# Patient Record
Sex: Female | Born: 1935 | Race: White | Hispanic: No | Marital: Married | Smoking: Never smoker
Health system: Southern US, Community
[De-identification: ages and names within clinical notes are randomized; demographics above are authoritative.]

## PROBLEM LIST (undated history)

## (undated) DIAGNOSIS — A498 Other bacterial infections of unspecified site: Secondary | ICD-10-CM

## (undated) HISTORY — PX: BLADDER SURGERY: SHX569

## (undated) HISTORY — PX: OTHER SURGICAL HISTORY: SHX169

## (undated) HISTORY — PX: CHOLECYSTECTOMY: SHX55

## (undated) HISTORY — PX: FOOT SURGERY: SHX648

## (undated) HISTORY — PX: ABDOMINAL HYSTERECTOMY: SHX81

---

## 1998-11-06 ENCOUNTER — Ambulatory Visit: Admission: RE | Admit: 1998-11-06 | Discharge: 1998-11-06 | Payer: Self-pay | Admitting: Family Medicine

## 1999-08-12 ENCOUNTER — Encounter: Admission: RE | Admit: 1999-08-12 | Discharge: 1999-08-12 | Payer: Self-pay | Admitting: Family Medicine

## 1999-08-12 ENCOUNTER — Encounter: Payer: Self-pay | Admitting: Family Medicine

## 2000-08-12 ENCOUNTER — Encounter: Admission: RE | Admit: 2000-08-12 | Discharge: 2000-08-12 | Payer: Self-pay | Admitting: Family Medicine

## 2000-08-12 ENCOUNTER — Encounter: Payer: Self-pay | Admitting: Family Medicine

## 2001-08-15 ENCOUNTER — Encounter: Admission: RE | Admit: 2001-08-15 | Discharge: 2001-08-15 | Payer: Self-pay | Admitting: Family Medicine

## 2001-08-15 ENCOUNTER — Encounter: Payer: Self-pay | Admitting: Family Medicine

## 2001-11-29 ENCOUNTER — Ambulatory Visit (HOSPITAL_COMMUNITY): Admission: RE | Admit: 2001-11-29 | Discharge: 2001-11-29 | Payer: Self-pay | Admitting: *Deleted

## 2001-11-29 ENCOUNTER — Encounter: Payer: Self-pay | Admitting: *Deleted

## 2001-12-06 ENCOUNTER — Encounter: Payer: Self-pay | Admitting: General Surgery

## 2001-12-07 ENCOUNTER — Ambulatory Visit (HOSPITAL_COMMUNITY): Admission: RE | Admit: 2001-12-07 | Discharge: 2001-12-07 | Payer: Self-pay | Admitting: General Surgery

## 2001-12-22 ENCOUNTER — Observation Stay (HOSPITAL_COMMUNITY): Admission: RE | Admit: 2001-12-22 | Discharge: 2001-12-23 | Payer: Self-pay | Admitting: General Surgery

## 2001-12-22 ENCOUNTER — Encounter (INDEPENDENT_AMBULATORY_CARE_PROVIDER_SITE_OTHER): Payer: Self-pay | Admitting: Specialist

## 2002-08-22 ENCOUNTER — Encounter: Admission: RE | Admit: 2002-08-22 | Discharge: 2002-08-22 | Payer: Self-pay | Admitting: Family Medicine

## 2002-08-22 ENCOUNTER — Encounter: Payer: Self-pay | Admitting: Family Medicine

## 2003-09-17 ENCOUNTER — Encounter: Admission: RE | Admit: 2003-09-17 | Discharge: 2003-09-17 | Payer: Self-pay | Admitting: Family Medicine

## 2003-11-06 ENCOUNTER — Other Ambulatory Visit: Admission: RE | Admit: 2003-11-06 | Discharge: 2003-11-06 | Payer: Self-pay | Admitting: Family Medicine

## 2004-09-21 ENCOUNTER — Encounter: Admission: RE | Admit: 2004-09-21 | Discharge: 2004-09-21 | Payer: Self-pay | Admitting: Family Medicine

## 2005-09-27 ENCOUNTER — Encounter: Admission: RE | Admit: 2005-09-27 | Discharge: 2005-09-27 | Payer: Self-pay | Admitting: Family Medicine

## 2006-10-10 ENCOUNTER — Encounter: Admission: RE | Admit: 2006-10-10 | Discharge: 2006-10-10 | Payer: Self-pay | Admitting: Family Medicine

## 2007-10-12 ENCOUNTER — Encounter: Admission: RE | Admit: 2007-10-12 | Discharge: 2007-10-12 | Payer: Self-pay | Admitting: Family Medicine

## 2008-09-10 ENCOUNTER — Ambulatory Visit: Payer: Self-pay | Admitting: Orthopedic Surgery

## 2008-09-10 DIAGNOSIS — M5137 Other intervertebral disc degeneration, lumbosacral region: Secondary | ICD-10-CM | POA: Insufficient documentation

## 2008-09-10 DIAGNOSIS — M4120 Other idiopathic scoliosis, site unspecified: Secondary | ICD-10-CM | POA: Insufficient documentation

## 2008-09-12 ENCOUNTER — Encounter: Payer: Self-pay | Admitting: Orthopedic Surgery

## 2008-09-23 ENCOUNTER — Encounter: Payer: Self-pay | Admitting: Orthopedic Surgery

## 2008-10-14 ENCOUNTER — Encounter: Admission: RE | Admit: 2008-10-14 | Discharge: 2008-10-14 | Payer: Self-pay | Admitting: Family Medicine

## 2009-07-15 ENCOUNTER — Telehealth: Payer: Self-pay | Admitting: Orthopedic Surgery

## 2009-10-15 ENCOUNTER — Encounter: Admission: RE | Admit: 2009-10-15 | Discharge: 2009-10-15 | Payer: Self-pay | Admitting: Family Medicine

## 2010-06-25 NOTE — Progress Notes (Signed)
Summary: call from patient from [place]  Phone Note Call from Patient   Caller: Patient Summary of Call: Patient called from [place], where she will be until April. States she is having a recurrance of some of same symptoms, leg pain, difficulty walking, again.  Asked if Dr Romeo Apple can order a refill on the Rx, and have it faxed to [facility] in [place]. Initial call taken by: Cammie Sickle,  July 15, 2009 1:28 PM  Follow-up for Phone Call        Advised, as per nurse, patient needs to be re-evaluated.  Her last visit was in April 2010.  Also advised contacting primary care physician.  Pt understands and will follow advice, and will contact us upon her return if needs to schedule appt then. Follow-up by: Cammie Sickle,  July 15, 2009 1:36 PM  Additional Follow-up for Phone Call Additional follow up Details #1::        no my script wont work in [place] but if it is a chain it may work

## 2010-08-20 ENCOUNTER — Inpatient Hospital Stay (HOSPITAL_COMMUNITY)
Admission: EM | Admit: 2010-08-20 | Discharge: 2010-08-28 | DRG: 371 | Disposition: A | Discharge status: Home / Self Care | Payer: Medicare Other | Attending: Internal Medicine | Admitting: Internal Medicine

## 2010-08-20 DIAGNOSIS — Z79899 Other long term (current) drug therapy: Secondary | ICD-10-CM

## 2010-08-20 DIAGNOSIS — E039 Hypothyroidism, unspecified: Secondary | ICD-10-CM | POA: Diagnosis present

## 2010-08-20 DIAGNOSIS — E876 Hypokalemia: Secondary | ICD-10-CM | POA: Diagnosis not present

## 2010-08-20 DIAGNOSIS — A0472 Enterocolitis due to Clostridium difficile, not specified as recurrent: Principal | ICD-10-CM | POA: Diagnosis present

## 2010-08-20 DIAGNOSIS — K5710 Diverticulosis of small intestine without perforation or abscess without bleeding: Secondary | ICD-10-CM | POA: Diagnosis present

## 2010-08-20 DIAGNOSIS — N390 Urinary tract infection, site not specified: Secondary | ICD-10-CM | POA: Diagnosis present

## 2010-08-20 DIAGNOSIS — E43 Unspecified severe protein-calorie malnutrition: Secondary | ICD-10-CM | POA: Diagnosis present

## 2010-08-20 DIAGNOSIS — D649 Anemia, unspecified: Secondary | ICD-10-CM | POA: Diagnosis present

## 2010-08-20 DIAGNOSIS — E871 Hypo-osmolality and hyponatremia: Secondary | ICD-10-CM | POA: Diagnosis present

## 2010-08-20 DIAGNOSIS — K449 Diaphragmatic hernia without obstruction or gangrene: Secondary | ICD-10-CM | POA: Diagnosis present

## 2010-08-20 DIAGNOSIS — IMO0002 Reserved for concepts with insufficient information to code with codable children: Secondary | ICD-10-CM

## 2010-08-20 LAB — URINALYSIS, ROUTINE W REFLEX MICROSCOPIC
Nitrite: NEGATIVE
Protein, ur: NEGATIVE mg/dL
Urobilinogen, UA: 0.2 mg/dL (ref 0.0–1.0)
pH: 5.5 (ref 5.0–8.0)

## 2010-08-20 LAB — COMPREHENSIVE METABOLIC PANEL
ALT: 19 U/L (ref 0–35)
AST: 23 U/L (ref 0–37)
CO2: 24 mEq/L (ref 19–32)
Chloride: 88 mEq/L — ABNORMAL LOW (ref 96–112)
Creatinine, Ser: 1.35 mg/dL — ABNORMAL HIGH (ref 0.4–1.2)
Glucose, Bld: 129 mg/dL — ABNORMAL HIGH (ref 70–99)
Total Protein: 4.4 g/dL — ABNORMAL LOW (ref 6.0–8.3)

## 2010-08-20 LAB — CBC
HCT: 29.3 % — ABNORMAL LOW (ref 36.0–46.0)
Hemoglobin: 9.9 g/dL — ABNORMAL LOW (ref 12.0–15.0)
MCHC: 33.8 g/dL (ref 30.0–36.0)
MCV: 86.2 fL (ref 78.0–100.0)
Platelets: 391 10*3/uL (ref 150–400)
WBC: 30.1 10*3/uL — ABNORMAL HIGH (ref 4.0–10.5)

## 2010-08-20 LAB — DIFFERENTIAL
Eosinophils Absolute: 0 10*3/uL (ref 0.0–0.7)
Eosinophils Relative: 0 % (ref 0–5)
Lymphocytes Relative: 41 % (ref 12–46)
Lymphs Abs: 12.3 10*3/uL — ABNORMAL HIGH (ref 0.7–4.0)
Neutro Abs: 16.3 10*3/uL — ABNORMAL HIGH (ref 1.7–7.7)

## 2010-08-20 LAB — URINE MICROSCOPIC-ADD ON

## 2010-08-21 LAB — COMPREHENSIVE METABOLIC PANEL
AST: 20 U/L (ref 0–37)
Alkaline Phosphatase: 55 U/L (ref 39–117)
CO2: 24 mEq/L (ref 19–32)
Calcium: 7.3 mg/dL — ABNORMAL LOW (ref 8.4–10.5)
Chloride: 92 mEq/L — ABNORMAL LOW (ref 96–112)
Creatinine, Ser: 0.95 mg/dL (ref 0.4–1.2)
GFR calc Af Amer: >60 mL/min (ref >60)
Potassium: 3.9 mEq/L (ref 3.5–5.1)
Sodium: 123 mEq/L — ABNORMAL LOW (ref 135–145)

## 2010-08-21 LAB — CBC
HCT: 27.7 % — ABNORMAL LOW (ref 36.0–46.0)
Hemoglobin: 9.6 g/dL — ABNORMAL LOW (ref 12.0–15.0)
MCH: 30 pg (ref 26.0–34.0)
MCHC: 34.7 g/dL (ref 30.0–36.0)
RDW: 13.6 % (ref 11.5–15.5)

## 2010-08-21 LAB — PATHOLOGIST SMEAR REVIEW

## 2010-08-21 LAB — IRON AND TIBC
Iron: <10 ug/dL — ABNORMAL LOW (ref 42–135)
UIBC: 133 ug/dL

## 2010-08-21 LAB — FOLATE: Folate: 8.2 ng/mL

## 2010-08-21 LAB — CLOSTRIDIUM DIFFICILE BY PCR

## 2010-08-21 LAB — T4, FREE: Free T4: 1.19 ng/dL (ref 0.80–1.80)

## 2010-08-22 ENCOUNTER — Other Ambulatory Visit: Payer: Self-pay | Admitting: Gastroenterology

## 2010-08-22 LAB — BASIC METABOLIC PANEL
BUN: 16 mg/dL (ref 6–23)
Calcium: 7.6 mg/dL — ABNORMAL LOW (ref 8.4–10.5)
Creatinine, Ser: 0.68 mg/dL (ref 0.4–1.2)
GFR calc non Af Amer: >60 mL/min (ref >60)
Potassium: 4.1 mEq/L (ref 3.5–5.1)
Sodium: 127 mEq/L — ABNORMAL LOW (ref 135–145)

## 2010-08-22 LAB — URINE CULTURE

## 2010-08-22 LAB — FECAL LACTOFERRIN, QUANT: Fecal Lactoferrin: POSITIVE

## 2010-08-22 LAB — CBC
MCH: 29.6 pg (ref 26.0–34.0)
MCHC: 33.4 g/dL (ref 30.0–36.0)
MCV: 88.5 fL (ref 78.0–100.0)

## 2010-08-23 LAB — BASIC METABOLIC PANEL
Calcium: 7.1 mg/dL — ABNORMAL LOW (ref 8.4–10.5)
Chloride: 104 mEq/L (ref 96–112)
Creatinine, Ser: 0.56 mg/dL (ref 0.4–1.2)
GFR calc Af Amer: >60 mL/min (ref >60)
Sodium: 128 mEq/L — ABNORMAL LOW (ref 135–145)

## 2010-08-23 LAB — CBC
MCH: 29.7 pg (ref 26.0–34.0)
Platelets: 406 10*3/uL — ABNORMAL HIGH (ref 150–400)
RBC: 3.44 MIL/uL — ABNORMAL LOW (ref 3.87–5.11)
WBC: 16.9 10*3/uL — ABNORMAL HIGH (ref 4.0–10.5)

## 2010-08-24 LAB — OVA AND PARASITE EXAMINATION

## 2010-08-24 LAB — CBC
Hemoglobin: 10.5 g/dL — ABNORMAL LOW (ref 12.0–15.0)
MCV: 91.5 fL (ref 78.0–100.0)
Platelets: 378 10*3/uL (ref 150–400)
RBC: 3.54 MIL/uL — ABNORMAL LOW (ref 3.87–5.11)
WBC: 16.9 10*3/uL — ABNORMAL HIGH (ref 4.0–10.5)

## 2010-08-24 LAB — BASIC METABOLIC PANEL
CO2: 19 mEq/L (ref 19–32)
Chloride: 106 mEq/L (ref 96–112)
GFR calc Af Amer: >60 mL/min (ref >60)
Potassium: 4.6 mEq/L (ref 3.5–5.1)

## 2010-08-24 LAB — FECAL FAT, QUALITATIVE: Free fatty acids: NORMAL

## 2010-08-25 LAB — CARDIAC PANEL(CRET KIN+CKTOT+MB+TROPI)
CK, MB: 3.2 ng/mL (ref 0.3–4.0)
CK, MB: 4.4 ng/mL — ABNORMAL HIGH (ref 0.3–4.0)
Relative Index: INVALID (ref 0.0–2.5)
Total CK: 19 U/L (ref 7–177)

## 2010-08-25 LAB — CBC
MCH: 30.2 pg (ref 26.0–34.0)
MCHC: 32.3 g/dL (ref 30.0–36.0)
MCV: 93.4 fL (ref 78.0–100.0)
Platelets: 381 10*3/uL (ref 150–400)
RDW: 15.1 % (ref 11.5–15.5)

## 2010-08-25 LAB — BASIC METABOLIC PANEL
BUN: 7 mg/dL (ref 6–23)
CO2: 20 mEq/L (ref 19–32)
Chloride: 107 mEq/L (ref 96–112)
Glucose, Bld: 103 mg/dL — ABNORMAL HIGH (ref 70–99)
Potassium: 4.8 mEq/L (ref 3.5–5.1)

## 2010-08-25 LAB — STOOL CULTURE

## 2010-08-25 LAB — MAGNESIUM: Magnesium: 1.7 mg/dL (ref 1.5–2.5)

## 2010-08-25 NOTE — H&P (Signed)
[name], BAUS NO.:  [id number]  MEDICAL RECORD NO.:  [id number]           PATIENT TYPE:  E  LOCATION:  WLED                         FACILITY:  [facility]  PHYSICIAN:  Vania Rea, M.D. DATE OF BIRTH:  07/20/1930  DATE OF ADMISSION:  08/20/2010 DATE OF DISCHARGE:                             HISTORY & PHYSICAL   PRIMARY CARE PHYSICIAN:  Molly Maduro A. Nicholos Johns, M.D.  UROLOGIST:  Martina Sinner, MD  CHIEF COMPLAINT:  Diarrhea for the past 6 months.  HISTORY OF PRESENT ILLNESS:  This is a 77 year old Caucasian lady with no significant past medical history, has always been in very good health but tends to travel between [place] and her home in [place] and has noted diarrhea for the past 6 months.  When the diarrhea started around about in October, November, the patient had been self-medicating with over-the-counter preparations but as it persisted when she went to [place] in January, she sought medical attention.  She was evaluated. No clear cause could be found.  She was referred to gastroenterologist who ordered a CT scan of the abdomen and pelvis but because she left [place] and came back to [place], she did not get the results until this week.  By her and her daughters reports, the result showed thickening of the bladder with bladder mass and she was referred to Dr. Alfredo Martinez of Urology.  She saw Dr. Sherron Monday yesterday who by the patient's report did not feel there was anything of significance or great concern on the CAT scan but she was started on Cipro for urinary tract infection.  Nevertheless, because of her progressive weakness and now even difficulty getting to the bathroom, she was brought in by her daughter and in our emergency room, lab work was done which revealed a sodium of 120, a white count of 30,000 and the hospitalist service was called to assist with management.  Although, the patient says this has been going on for about  6 months, she later revealed that "she had a colonoscopy done about 1 year ago when this was all started."  No biopsies were done because they said everything was normal.  She has never had any blood or pus in the diarrhea.  She has never had any fever or chills.  States stools are more than 20 stools per day. She has taken to only drinking Gatorade and liquids because if she eats any solid food, she tends to have explosive diarrhea.  As a consequence, she has become very weak and undernourished  The patient has been blood donor but sometimes she goes, she has been unable to donate blood because she has been anemic.  She last donated 1 year ago.  She said her hemoglobin was 11.5 at that time.  PAST MEDICAL HISTORY:  Chronic diarrhea as noted above, hypothyroidism, recently diagnosed within the past few months.  She is status post laparoscopic cholecystectomy.  Denies any other medical problems.  MEDICATIONS: 1. Synthroid 50 mcg daily. 2. Cipro started yesterday, 250 mg twice daily. 3. Lomotil 2 tablets daily as needed for loose stools. 4. Xalatan eyedrops 1 drop daily in  both eyes.  ALLERGIES:  No known drug allergies.  SOCIAL HISTORY:  Drinks occasionally.  Denies tobacco, alcohol or illicit drug use.  She is a retired Architectural technologist.  FAMILY HISTORY:  Significant for mother who had coronary artery disease, father who had skin cancer.  Brother with Alzheimer's, another brother has some type of heart problems and wears a defibrillator.  She has a 75- year-old grandson recent successful treatment for acute leukemia.  REVIEW OF SYSTEMS:  Other than noted above significant only for shortness of breath related to her weakness.  PHYSICAL EXAMINATION:  GENERAL:  A very pleasant but also very weak looking elderly Caucasian lady reclining in the stretcher. VITALS:  Temperature is 97.4, pulse 88, respiration 20, blood pressure 120/90.  She is saturating 100% on 2 L. HEENT:  Her  pupils are round and equal.  Mucous membranes pale. Anicteric.  No cervical lymphadenopathy, no thyromegaly.  No carotid bruit. CHEST:  The chest is clear to auscultation bilaterally. CARDIOVASCULAR SYSTEM:  Regular rhythm.  No murmur. ABDOMEN:  Obese, soft, nontender.  No masses. EXTREMITIES:  She has 1+ soft pitting edema bilaterally.  Dorsalis pedis pulses are 1+ bilaterally.  Toes are warm.  She has bruising of the left ankle, status post one of her many falls because she is so weak. CENTRAL NERVOUS SYSTEM:  Cranial nerves II-XII are grossly intact.  She has no focal neurologic deficit.  LABORATORY DATA:  Her white count is 30.1, hemoglobin 9.9, MCV 86.2, platelets 391, absolute neutrophil count is 16.3, absolute lymphocyte count is 12.3.  Morphology shows absolute lymphocytosis, smudge cells, toxic granulations, polychromasia present, pathologist review is pending.  Her sodium is marked low at 120.  Her potassium is 4.3.  Her chloride is 88.  CO2 24, glucose 129, BUN 46, creatinine 1.35.  Her liver functions unremarkable.  Her total protein 4.4, albumin 1.7, calcium 7.2.  Urinalysis shows cloudy urine.  Small amount of blood. Negative for nitrites, leukocyte esterase small, negative for proteins. Urine microscopy shows 3 to 6 white cells and many bacteria.  No radiologic studies have been done.  ASSESSMENT: 1. Recalcitrant diarrhea. 2. Dehydration. 3. Malnutrition secondary to chronic diarrhea. 4. Hyponatremia due to malnutrition and dehydration. 5. Questionable early chronic lymphocytic leukemia. 6. Urinary tract infection. 7. Chronic anemia.  PLAN:  We will get Hem/Onc consult to be sure this leukemia is not contributing to her recalcitrant diarrhea.  I have discuss it with hematologist on call and she feels this is unlikely. 1. We will get a stool evaluation and consult gastroenterologist for     management with possibly for biopsy. 2. We will check her thyroid function  tests, although this does not     appear compatible with hyperdefecation associated with     hyperthyroidism. 3. We will culture urine and we will continue the Cipro. 4. We will add Flagyl and add C diff prophylaxis pending the results     of her C diff PCR. 5. We will hydrate with normal saline for hyponatremia. 6. We will consult nutrition for assistance with management but for     the time being, we will continue on a liquid diet. 7. Other plans as per orders.     Vania Rea, M.D.     LC/MEDQ  D:  08/20/2010  T:  08/21/2010  Job:  [id number]  cc:   Molly Maduro A. Nicholos Johns, M.D. Fax: [phone]  Martina Sinner, MD Fax: [phone]  Petra Kuba, M.D. Fax: [phone]  Electronically Signed  by Vania Rea M.D. on 08/25/2010 01:25:08 AM

## 2010-08-26 LAB — CBC
HCT: 32.2 % — ABNORMAL LOW (ref 36.0–46.0)
MCV: 92.8 fL (ref 78.0–100.0)
Platelets: 353 10*3/uL (ref 150–400)
RBC: 3.47 MIL/uL — ABNORMAL LOW (ref 3.87–5.11)
RDW: 15.3 % (ref 11.5–15.5)
WBC: 13.9 10*3/uL — ABNORMAL HIGH (ref 4.0–10.5)

## 2010-08-27 ENCOUNTER — Inpatient Hospital Stay (HOSPITAL_COMMUNITY): Payer: Medicare Other

## 2010-08-27 LAB — DIFFERENTIAL
Basophils Absolute: 0 10*3/uL (ref 0.0–0.1)
Eosinophils Relative: 1 % (ref 0–5)
Lymphocytes Relative: 52 % — ABNORMAL HIGH (ref 12–46)
Lymphs Abs: 6.9 10*3/uL — ABNORMAL HIGH (ref 0.7–4.0)
Monocytes Relative: 5 % (ref 3–12)
Neutro Abs: 5.5 10*3/uL (ref 1.7–7.7)

## 2010-08-27 LAB — COMPREHENSIVE METABOLIC PANEL
ALT: 19 U/L (ref 0–35)
AST: 22 U/L (ref 0–37)
Alkaline Phosphatase: 46 U/L (ref 39–117)
CO2: 21 mEq/L (ref 19–32)
Chloride: 112 mEq/L (ref 96–112)
GFR calc Af Amer: >60 mL/min (ref >60)
GFR calc non Af Amer: >60 mL/min (ref >60)
Glucose, Bld: 115 mg/dL — ABNORMAL HIGH (ref 70–99)
Potassium: 3.9 mEq/L (ref 3.5–5.1)
Sodium: 132 mEq/L — ABNORMAL LOW (ref 135–145)
Total Bilirubin: 0.4 mg/dL (ref 0.3–1.2)

## 2010-08-27 LAB — CBC
HCT: 30.8 % — ABNORMAL LOW (ref 36.0–46.0)
Hemoglobin: 10 g/dL — ABNORMAL LOW (ref 12.0–15.0)
RBC: 3.34 MIL/uL — ABNORMAL LOW (ref 3.87–5.11)

## 2010-08-27 LAB — MAGNESIUM: Magnesium: 1.5 mg/dL (ref 1.5–2.5)

## 2010-08-27 NOTE — Progress Notes (Signed)
[name], [name]               ACCOUNT NO.:  [id number]  MEDICAL RECORD NO.:  [id number]           PATIENT TYPE:  I  LOCATION:  1505                         FACILITY:  [facility]  PHYSICIAN:  Peggye Pitt, M.D. DATE OF BIRTH:  February 18, 1931                                PROGRESS NOTE   DATE OF DISCHARGE: Still to be determined.  DIAGNOSES UP-TO-DATE: 1. Chronic diarrhea with current C diff colitis, persistent. 2. Hypothyroidism. 3. Hyponatremia. 4. Leukocytosis. 5. Protein caloric malnutrition.  PHYSICAL EXAM: GENERAL:  She is alert, awake, oriented x3, in no distress.  Jovial, pleasant, talkative. HEENT:  Normocephalic, atraumatic.  Her pupils are equally round and reactive to light.  She has intact extraocular movements. NECK:  Supple.  No JVD, no lymphadenopathy, no bruits, no goiter. HEART:  Regular rate and rhythm with no murmurs, rubs, or gallops. LUNGS:  Clear to auscultation bilaterally. ABDOMEN:  Slightly distended with positive bowel sounds. EXTREMITIES:  She has 1+ edema bilaterally with no clubbing, cyanosis. She does have positive pulses.  LABORATORY DATA: Today include sodium of 130, potassium 4.8, chloride 107, bicarb 20, BUN 7, creatinine 0.63 with a glucose of 103.  WBC is 15.2, hemoglobin 10.6, and a platelet count of 381, and a magnesium level of 1.7.  HOSPITAL COURSE UP-TO-DATE: [name] is a very pleasant 77 year old Caucasian lady who initially presented to the hospital on March 29 with complaints of progressive worsening of her diarrhea.  It appears that she has had diarrhea for about 18 months, but over the past 2 to 3 weeks, it had gotten progressively worse to the point where she has now developed bowel incontinence.  Because of that, she decided to come into the hospital for further evaluation. 1. Chronic diarrhea.  She has now had a positive C diff PCR.  She has     had an EGD and colonoscopy with findings of pseudomembranous     colitis.   Biopsy results have come back today as only significant     for the C diff colitis, but there was no evidence for anything else     like celiac sprue or microscopic colitis.  I sincerely doubt that     she has been having C diff resulting in diarrhea for the past 18     months, although this could certainly explain her worsening over     the past 3 weeks.  She has been started on Flagyl initially p.o.     and then transitioned over to IV because of her decreased gut     function.  Dr. Bosie Clos with [facility] has been following along with     [place].  His plan today is to start p.o. vancomycin in the morning if     the consistency of her diarrhea does not improve by tomorrow. 2. Hyponatremia.  Upon admission, she had a low sodium of 120.  This     has improved to 130 with IV fluids.  This was likely secondary to     dehydration secondary to her diarrhea. 3. Protein caloric malnutrition.  We have started her on Ensure.  She  has had a nutritional consultation. 4. Leukocytosis.  Upon arrival, she had a marked leukocytosis of     30,000.  This has dropped to 15,000 today.  I     believe that this is driven by her C diff colitis. 5. Hypothyroidism.  Her TSH has been within normal limits and we have     continued her home dose of Synthroid.     Peggye Pitt, M.D.     EH/MEDQ  D:  08/25/2010  T:  08/25/2010  Job:  [id number]  Electronically Signed by Peggye Pitt M.D. on 08/27/2010 08:50:09 PM

## 2010-08-28 LAB — COMPREHENSIVE METABOLIC PANEL
Alkaline Phosphatase: 43 U/L (ref 39–117)
BUN: 3 mg/dL — ABNORMAL LOW (ref 6–23)
Creatinine, Ser: 0.48 mg/dL (ref 0.4–1.2)
Glucose, Bld: 99 mg/dL (ref 70–99)
Potassium: 3.8 mEq/L (ref 3.5–5.1)
Total Bilirubin: 0.5 mg/dL (ref 0.3–1.2)
Total Protein: 3.7 g/dL — ABNORMAL LOW (ref 6.0–8.3)

## 2010-08-28 LAB — DIFFERENTIAL
Basophils Relative: 0 % (ref 0–1)
Eosinophils Absolute: 0.1 10*3/uL (ref 0.0–0.7)
Lymphocytes Relative: 65 % — ABNORMAL HIGH (ref 12–46)
Lymphs Abs: 7.3 10*3/uL — ABNORMAL HIGH (ref 0.7–4.0)
Monocytes Absolute: 0.6 10*3/uL (ref 0.1–1.0)
Neutro Abs: 3.3 10*3/uL (ref 1.7–7.7)

## 2010-08-28 LAB — CBC
HCT: 31.1 % — ABNORMAL LOW (ref 36.0–46.0)
MCH: 30.2 pg (ref 26.0–34.0)
MCV: 93.1 fL (ref 78.0–100.0)
RDW: 15.4 % (ref 11.5–15.5)
WBC: 11.3 10*3/uL — ABNORMAL HIGH (ref 4.0–10.5)

## 2010-08-28 LAB — MAGNESIUM: Magnesium: 1.5 mg/dL (ref 1.5–2.5)

## 2010-08-28 NOTE — Discharge Summary (Signed)
[name], [name] NO.:  [id number]  MEDICAL RECORD NO.:  [id number]           PATIENT TYPE:  I  LOCATION:  1505                         FACILITY:  [facility]  PHYSICIAN:  Talmage Nap, MD  DATE OF BIRTH:  04-10-1931  DATE OF ADMISSION:  08/20/2010 DATE OF DISCHARGE:  08/28/2010                        DISCHARGE SUMMARY - REFERRING   PRIMARY CARE PHYSICIAN:  Molly Maduro A. Nicholos Johns, M.D.  PRIMARY UROLOGIST:  Martina Sinner, MD  CONSULTANT:  Consultant involved in the case is [facility] Dr. Bosie Clos.  DISCHARGE DIAGNOSES: 1. Chronic diarrhea secondary to clostridium difficile colitis. 2. Protein energy malnutrition. 3. Anemia. 4. Hypothyroidism. 5. Electrolyte imbalance i.e. hyponatremia and hypokalemia, corrected. 6. Urinary tract infection.  Please for initial hospital course on the patient up to August 25, 2010, refer to the progress note dictated by Dr. Peggye Pitt on August 25, 2010.  The patient was, however, seen by me for the very first time in this index admission on August 26, 2010, through discharge which is August 28, 2010, and during this encounter no major changes were made in the patient's management.  However, the patient was found to be dehydrated slightly and subsequently rate of IV fluid was increased to about 120 cc an hour.  The patient was followed by GI physician Dr. Bosie Clos.  She was however reevaluated by me on daily basis and the frequency of diarrhea reduced remarkably.  The patient was however found to have slight edema in the lower extremities, most likely secondary to protein energy malnutrition.  The patient was however seen by me today feeling better, diarrhea has almost completely resolved.  Examination of the patient showed trace edema in the lower extremities.  Her vital signs, blood pressure 111/70, pulse 92, respiratory rate 16, temperature 97.9.  It is important to mention that on August 27, 2010, the patient complained  about cough and examination showed slight congestion in the lungs and subsequently the patient was given Lasix and a followup examination showed that lungs was very clear.  LABORATORY DATA:  Complete blood count with differential done on August 28, 2010, showed WBC 11.3, hemoglobin 10.1, hematocrit 31.1, MCV of 93.2 with platelet count of 360.  Differential, neutrophils 29%, lymphocytes 65% and elevated.  Magnesium level 1.5.  Comprehensive metabolic panel showed sodium of 137, potassium of 3.8, chloride of 111 with a bicarb of 23, glucose is 99, BUN is 3, creatinine 0.48.  LFTs showed total bilirubin 0.5, AST 43, AST 22, and alkaline phosphatase of 20, total protein is 3.7, albumin is 1.7.  BNP less than 30.0.  Three set of cardiac markers, troponin-I unremarkable.  Urine culture done on August 20, 2010, grew enterococcus species sensitive to vancomycin, nitrofurantoin, levofloxacin, ampicillin.  So far the patient has remained clinically stable, reevaluated by me today which is August 28, 2010, and examination showed trace edema in the lower extremities and vital signs of blood pressure  111/70, pulse 92, respiratory rate 16, temperature 97.9, medically stable.  Plan is for the patient to be discharged home today on activity as tolerated, high-protein diet.  Follow up with her primary care physician in  1 to 2 weeks and also with Dr. Bosie Clos, the GI physician, in [place] in 1 week.  Telephone number is [phone].  MEDICATIONS:  Medication to be taken at home include: 1. Ensure 1 can 3 times with meals. 2. Flora-Q  (intestinal flora) 1 capsule p.o. b.i.d. 3. Metronidazole 500 mg 1 p.o. t.i.d. for the next 14 days. 4. Vancomycin 250 mg 1 tablet 4 times a day for the next 14 days. 5. Levothyroxine 50 mcg one p.o. daily. 6. Lomotil 2 tablets with each bowel motion, do not exceed 8 tablets     in 24 hours. 7. Xalatan (latanoprost) 0.005% ophthalmic 1 drop in both eyes daily. 8. Synthroid  50 mcg 1 p.o. daily.     Talmage Nap, MD     CN/MEDQ  D:  08/28/2010  T:  08/28/2010  Job:  [id number]  cc:   Molly Maduro A. Nicholos Johns, M.D. Fax: [phone]  Electronically Signed by Talmage Nap  on 08/28/2010 04:36:02 PM

## 2010-09-13 NOTE — Op Note (Signed)
  NAMEAMBRIELLE, KINGTON               ACCOUNT NO.:  [id number]  MEDICAL RECORD NO.:  [id number]           PATIENT TYPE:  I  LOCATION:  1505                         FACILITY:  [facility]  PHYSICIAN:  Shirley Friar, MDDATE OF BIRTH:  1930-09-13  DATE OF PROCEDURE: DATE OF DISCHARGE:                              OPERATIVE REPORT   PROCEDURE:  Upper endoscopy.  INDICATIONS:  Anemia.  MEDICATIONS:  Versed 2 mg IV, Cetacaine spray x2, additional medicine given for preceding colonoscopy.  DESCRIPTION OF PROCEDURE:  The endoscope was inserted through oropharynx and esophagus was evaluated which was normal in its entirety.  Endoscope was advanced down to the stomach which revealed normal-appearing gastric mucosa.  Retroflexion was done which revealed a medium-sized hiatal hernia.  Endoscope was straightened advanced to the duodenal bulb which was unremarkable.  Endoscope was then advanced into the second portion of duodenum which was mildly edematous.  A medium size diverticulum was noted.  Biopsies were taken of the second portion of duodenum for histologic purposes.  ASSESSMENT: 1. Mildly edematous duodenum - status post biopsies. 2. Duodenal diverticulum. 3. Hiatal hernia.  PLAN:  Follow up on path.     Shirley Friar, MD     VCS/MEDQ  D:  08/22/2010  T:  08/22/2010  Job:  [id number]  Electronically Signed by Charlott Rakes MD on 09/13/2010 11:43:56 AM

## 2010-09-13 NOTE — Consult Note (Signed)
  NAMECHAELYN, BUNYAN               ACCOUNT NO.:  [id number]  MEDICAL RECORD NO.:  [id number]           PATIENT TYPE:  I  LOCATION:  1505                         FACILITY:  [facility]  PHYSICIAN:  Shirley Friar, MDDATE OF BIRTH:  September 08, 1930  DATE OF CONSULTATION: DATE OF DISCHARGE:                                CONSULTATION   REQUESTING PHYSICIAN:  Dr. Orvan Falconer.  INDICATIONS:  Diarrhea.  HISTORY OF PRESENT ILLNESS:  Ms. Loveall is a pleasant 79-year white female who has been having over 6 months of watery to loose diarrhea that occurs up to 20 times per day at times.  She denies any rectal bleeding.  She has had 20-pound unintentional weight loss in the past 3 months.  She reports a colonoscopy a year ago and states that it was normal but states that no biopsies were done.  The records from that colonoscopy not available at time of this dictation.  She denies any abdominal pain.  She has occasional nausea and vomiting.  On presentation here, white blood count 30,000 and hemoglobin 9.9.  She has been medically managed for her chronic diarrhea and dehydration and a Hematology consult has been called by the primary team due to her leukocytosis.  PAST MEDICAL HISTORY: 1. Hypothyroidism. 2. Status post laparoscopic cholecystectomy. 3. Urinary tract infection.  MEDICATIONS:  On admission, Synthroid, Cipro, Lomotil, Xalatan eyedrops.  ALLERGIES:  No known drug allergies.  FAMILY HISTORY:  Noncontributory, and denies any family history of pancreatic diseases.  SOCIAL HISTORY:  Occasional alcohol, denies tobacco or drugs.  REVIEW OF SYSTEMS:  Negative from GI standpoint except as stated above.  PHYSICAL EXAMINATION:  VITAL SIGNS:  Temperature 98.3, pulse 85, blood pressure 94/56. GENERAL:  Alert, in no acute distress. ABDOMEN:  Soft, nontender, nondistended.  LABORATORY DATA:  White blood count 30.1, hemoglobin 9.9, platelet count 391.  Sodium 120, potassium 4.3, chloride  88, CO2 of 24, BUN 46, creatinine 1.35, glucose 129.  All labs reviewed and listed in the hospital record.  IMPRESSION:  A 77 year old white female with chronic watery diarrhea with weight loss as stated above.  No evidence of fevers, chills, rectal bleeding in the presence of diarrhea.  Unlikely to be an infectious source.  Concern will be for malabsorptive source versus a paraneoplastic process.  Microscopic colitis is also possible.  Doubt C diff, but that needs to be ruled out as you are doing.  Will need to do colonoscopy with biopsies and plan to do this tomorrow with MiraLax prep.  Despite her chronic diarrhea, she does have some looseness to her stool and will try and achieve an adequate prep.  We will also do an upper endoscopy due to her anemia as well as her chronic diarrhea to check for any malabsorptive process in the duodenum.  Discussed with the patient and daughter and they agreed to proceed.     Shirley Friar, MD     VCS/MEDQ  D:  08/21/2010  T:  08/21/2010  Job:  [id number]  Electronically Signed by Charlott Rakes MD on 09/13/2010 11:43:46 AM

## 2010-09-13 NOTE — Op Note (Signed)
  [name], [name]               ACCOUNT NO.:  [id number]  MEDICAL RECORD NO.:  [id number]           PATIENT TYPE:  I  LOCATION:  1505                         FACILITY[facility]  [facility]  PHYSICIAN:  Shirley Friar, MDDATE OF BIRTH:  Jul 27, 1930  DATE OF PROCEDURE: DATE OF DISCHARGE:                              OPERATIVE REPORT   PROCEDURE:  Colonoscopy.  INDICATIONS:  Diarrhea, anemia.  MEDICATIONS: 1. Fentanyl 50 mcg IV. 2. Versed 3 mg IV.  FINDINGS:  Rectal exam was unremarkable.  DESCRIPTION OF PROCEDURE:  Pediatric colonoscope was inserted into a fair prepped colon.  Upon entering the rectum, the rectal mucosa was edematous and patchy pseudomembranes were seen.  The colonoscope was advanced further into the colon and there was diffuse pseudomembranes throughout the left side of the colon and transverse colon.  Excessive looping occurred upon reaching the proximal transverse and hepatic flexure and then a colonoscope was advanced to the ascending colon and then the procedure was terminated due to further looping and discomfort. On withdrawal of the colonoscope, biopsies were taken of the mucosa and the diffuse pseudomembranous colitis was again noted.  Retroflexion revealed small internal hemorrhoids.  ASSESSMENT: 1. Pseudomembranous colitis status post biopsies. 2. Biopsies were done to look for additional causes of her diarrhea in     addition to the pseudomembranes due to the chronicity of her     diarrhea.  PLAN: 1. Follow up on path. 2. Proceed with upper endoscopy due to anemia.     Shirley Friar, MD     VCS/MEDQ  D:  08/22/2010  T:  08/22/2010  Job:  [id number]  Electronically Signed by Charlott Rakes MD on 09/13/2010 11:43:51 AM

## 2010-10-09 NOTE — Op Note (Signed)
[name], [name]                        ACCOUNT NO.:  [id number]   MEDICAL RECORD NO.:  [id number]                   PATIENT TYPE:  OBV   LOCATION:  0340                                 FACILITY:  [facility]   PHYSICIAN:  Ollen Gross. Vernell Morgans, M.D.              DATE OF BIRTH:  [birth year]   DATE OF PROCEDURE:  12/22/2001  DATE OF DISCHARGE:  12/23/2001                                 OPERATIVE REPORT   PREOPERATIVE DIAGNOSIS:  Cholelithiasis   POSTOPERATIVE DIAGNOSIS:  Cholelithiasis.   PROCEDURE:  Laparoscopic cholecystectomy.   SURGEON:  Ollen Gross. Carolynne Edouard, M.D.   ASSISTANT:  Donnie Coffin. Samuella Cota, M.D.   ANESTHESIA:  General endotracheal.   PROCEDURE:  After informed consent was obtained, the patient was brought to  the operating room and placed in a supine position on the operating room  table.  After adequate induction of general endotracheal anesthesia, the  patient's abdomen was prepped with Betadine and draped in the usual sterile  manner.  The area below the umbilicus was infiltrated with 0.25% Marcaine  and a small vertically running incision was made with a #15 blade knife.  This incision was carried down through the subcutaneous tissue using blunt  dissection with Kelly clamp and [employer]-Nav[employer] retractors until the linea alba  was identified.  The linea alba was incised with a #15 blade knife and each  side was grasped with Kocher clamps and elevated anteriorly.  The  preperitoneal space was probed bluntly with the hemostat; the peritoneum was  opened and access was gained to the abdominal cavity.  A 0 Vicryl  pursestring stitch was placed in the fascia surrounding this opening, and a  Hasson cannula was placed through this opening and anchored in place with  the previously placed Vicryl pursestring stitch.  The abdomen was then  insufflated with carbon dioxide without difficulty.  The patient was placed  in the head-up position.  A laparoscope was inserted through the Hasson  cannula and the dome of the gallbladder and liver edge were readily  identifiable.  A small upper midline transverse incision was made with the  #15 blade knife, after infiltrating this area with 0.25% Marcaine.  A 10 mm  port was then placed bluntly through this incision into the abdominal cavity  under direct vision.  Dissection was then chosen laterally on the right side  of the abdomen, with placement of 5 mm ports, and each of these areas was  infiltrated with 0.25% Marcaine.  Small stab incisions were made with a #15  blade knife.  The 5 mm ports were placed bluntly through these incisions  into the abdominal cavity under direct vision.  A blunt grasper was placed  through the lateral-most 5 mm port and used to grasp the dome of gallbladder  and elevate it anteriorly and superiorly.  Another blunt grasper was placed  through another 5 mm port and used to retract  on the body and neck of the  gallbladder.  A dissector was placed through the upper midline port and  using the electrocautery the peritoneal reflection at the gallbladder neck  was opened.  Blunt dissection was then carried out in this area until the  gallbladder neck/cystic duct junction was readily identified.  This area was  dissected in a circumferential manner, taking care to keep the common duct  medial to this dissection until a good window was created.  Once this was  accomplished, three clips were placed proximally on the cystic duct and one  distally.  The cystic duct was divided between the two with laparoscopic  scissors.  Posterior to this, the cystic artery was identified and again  dissected in a circumferential manner bluntly, until a good window was  created.  Two clips were placed proximally and one distally on the artery.  The artery was divided between the two.  Next, a laparoscopic Cooke cautery  device was used to separate the gallbladder from the liver bed.  Prior to  completely detaching the gallbladder  from the liver bed, the liver bed was  inspected and several small bleeding points were coagulated with  electrocautery until the liver bed was completely hemostatic.  The  gallbladder was then detached the rest of the way from the liver bed.  The  laparoscope was then moved to the upper midline incision and gallbladder  graspers pushed through the Hasson cannula, and grasped the neck of the  gallbladder.  The gallbladder was removed with the Hasson cannula through  the infraumbilical port.  The fascial defect was then closed with the  previously placed Vicryl pursestring stitch under direct vision.  The  abdomen was then irrigated with copious amounts of saline until the effluent  was clear.  The ports were then removed under direct vision.  All were  hemostatic and the gas was allowed to escape.  The skin incisions were then  closed with interrupted 4-0 Monocryl subcuticular stitches.  Benzoin and  Steri-Strips and sterile dressings were applied.  The patient tolerated the  procedure well.  At the end of the case all sponge, needle and instrument  counts were correct.  The patient was awakened and taken to the recovery  room in stable condition.                                               Ollen Gross. Vernell Morgans, M.D.    PST/MEDQ  D:  01/17/2002  T:  01/19/2002  Job:  [id number]

## 2010-10-23 ENCOUNTER — Other Ambulatory Visit: Payer: Self-pay | Admitting: Family Medicine

## 2010-10-23 DIAGNOSIS — Z1231 Encounter for screening mammogram for malignant neoplasm of breast: Secondary | ICD-10-CM

## 2010-11-03 ENCOUNTER — Ambulatory Visit
Admission: RE | Admit: 2010-11-03 | Discharge: 2010-11-03 | Disposition: A | Discharge status: Home / Self Care | Payer: Medicare Other | Source: Ambulatory Visit | Attending: Family Medicine | Admitting: Family Medicine

## 2010-11-03 DIAGNOSIS — Z1231 Encounter for screening mammogram for malignant neoplasm of breast: Secondary | ICD-10-CM

## 2010-11-04 ENCOUNTER — Other Ambulatory Visit: Payer: Self-pay | Admitting: Gastroenterology

## 2011-02-08 ENCOUNTER — Other Ambulatory Visit: Payer: Self-pay | Admitting: Orthopedic Surgery

## 2011-02-08 ENCOUNTER — Telehealth: Payer: Self-pay | Admitting: Orthopedic Surgery

## 2011-02-08 DIAGNOSIS — M5430 Sciatica, unspecified side: Secondary | ICD-10-CM

## 2011-02-08 MED ORDER — GABAPENTIN 100 MG PO CAPS: 100.0000 mg | ORAL_CAPSULE | Freq: Three times a day (TID) | ORAL | Status: DC

## 2011-02-08 NOTE — Telephone Encounter (Signed)
Please get the pharmacy address [address] [facility]   Or the phone number

## 2011-02-08 NOTE — Telephone Encounter (Addendum)
The phone number in the note [facility] [phone]) is the pharmacy line-I also have a Direct Ph: [phone], per pharmacist.)  I called to verify it. [facility] location is listed as:   [address] [place], [place], [place] [address]  [facility] [phone]

## 2011-02-08 NOTE — Telephone Encounter (Signed)
Patient called to inquire about Rx to be phoned in per speaking with Dr. Romeo Apple.  States was to call the office to check on.  Pharmacy is [facility] [address] in [place], [place], pharmacy Doctor line [phone] Patient ph# is [phone]

## 2011-02-09 ENCOUNTER — Other Ambulatory Visit: Payer: Self-pay | Admitting: Orthopedic Surgery

## 2011-02-09 DIAGNOSIS — M5430 Sciatica, unspecified side: Secondary | ICD-10-CM

## 2011-02-09 MED ORDER — GABAPENTIN 100 MG PO CAPS: 100.0000 mg | ORAL_CAPSULE | Freq: Three times a day (TID) | ORAL | Status: DC

## 2011-02-09 NOTE — Telephone Encounter (Signed)
Sorry cant find to send in \\please pick up script at office

## 2011-02-09 NOTE — Telephone Encounter (Signed)
Called patient, notified, on the way here to pick it up in the office.

## 2011-05-30 IMAGING — CR DG CHEST 1V PORT
1 series · 1 of 1 positions shown · non-contrast
Comparison: None.

CLINICAL DATA: Pulmonary edema, congestion, weakness

PORTABLE CHEST - 1 VIEW

[view not recorded]
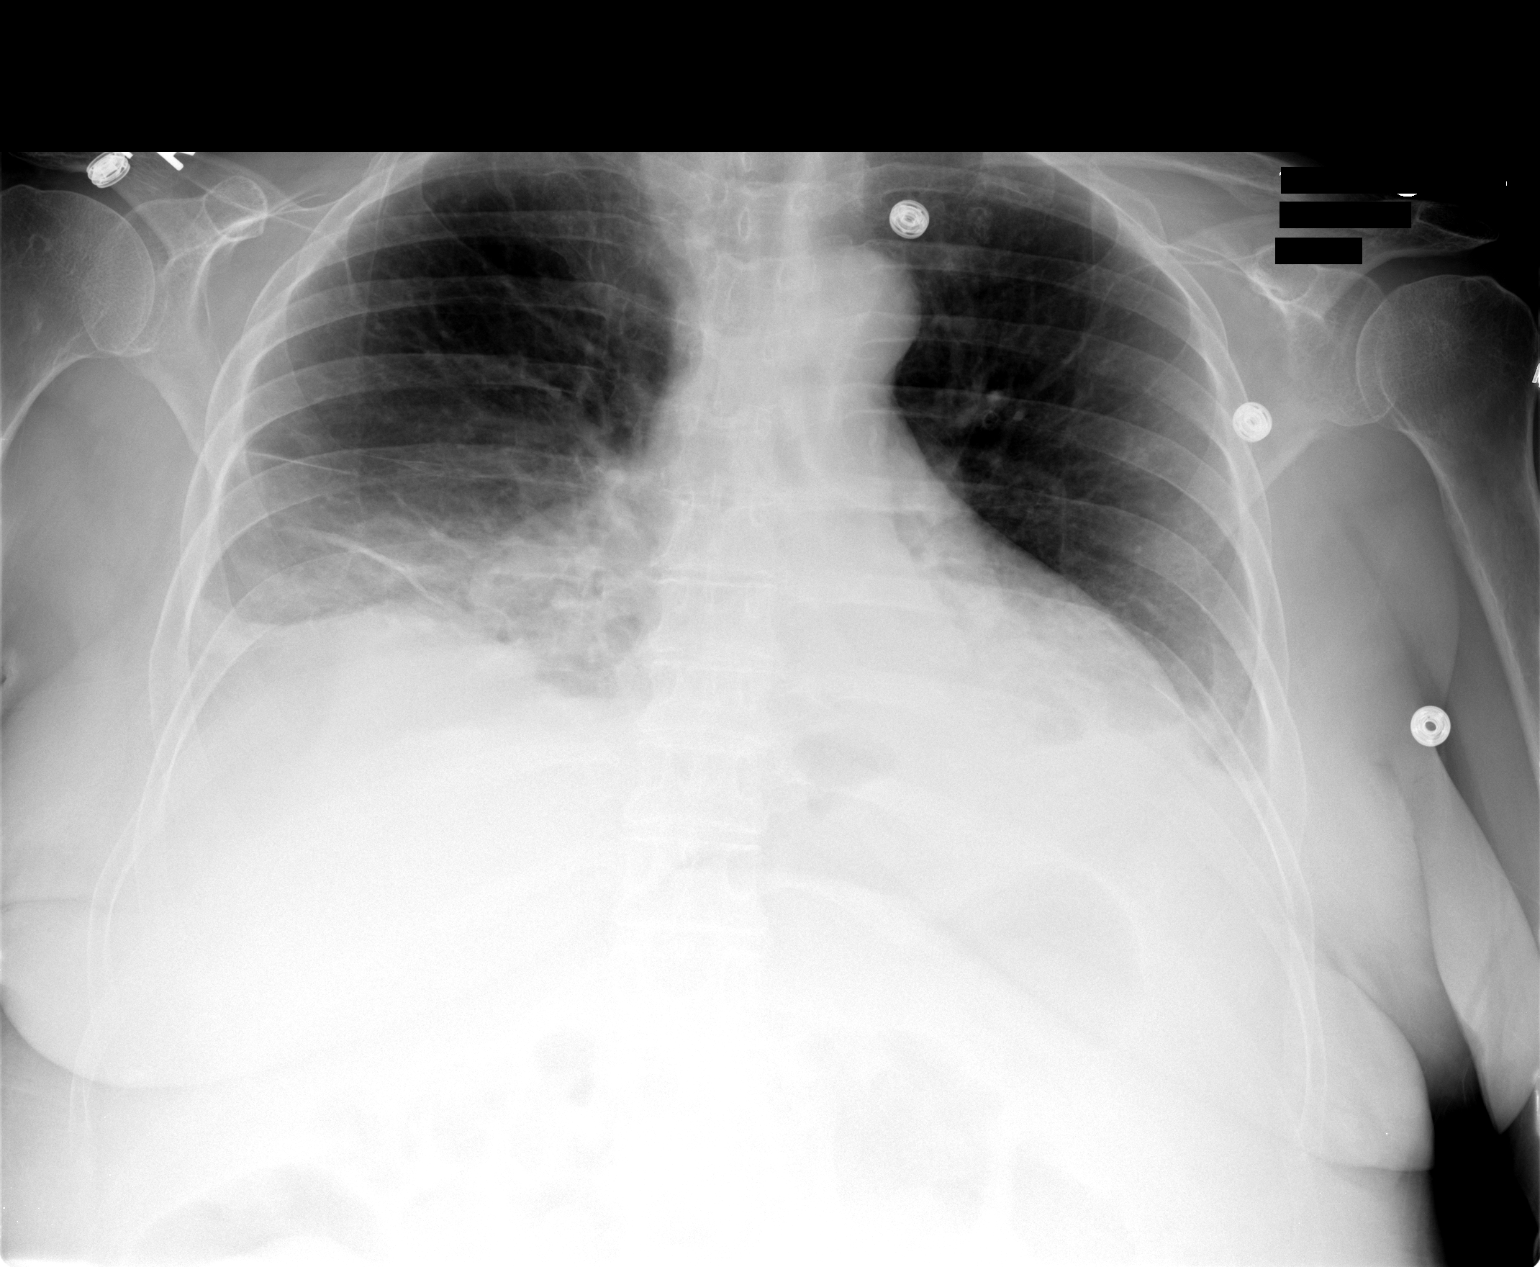

[1 of 1 positions shown; findings below may reference images not displayed]

FINDINGS: Decreased lung volumes with bilateral small pleural
effusions.  Basilar atelectasis noted.  Left lower lobe
retrocardiac consolidation evident.  Lower lobe pneumonia not
excluded.  Upper lobes clear.  No pneumothorax.  Normal heart size
and vascularity.  Degenerative changes of the spine.
IMPRESSION: Basilar atelectasis/airspace disease and small effusions, worse in
the left lower lobe.  Lower lobe pneumonia not excluded.

## 2011-07-13 ENCOUNTER — Other Ambulatory Visit: Payer: Self-pay | Admitting: Dermatology

## 2011-09-29 ENCOUNTER — Other Ambulatory Visit: Payer: Self-pay | Admitting: Family Medicine

## 2011-09-29 DIAGNOSIS — Z1231 Encounter for screening mammogram for malignant neoplasm of breast: Secondary | ICD-10-CM

## 2011-11-05 ENCOUNTER — Ambulatory Visit
Admission: RE | Admit: 2011-11-05 | Discharge: 2011-11-05 | Disposition: A | Discharge status: Home / Self Care | Payer: Medicare Other | Source: Ambulatory Visit | Attending: Family Medicine | Admitting: Family Medicine

## 2011-11-05 DIAGNOSIS — Z1231 Encounter for screening mammogram for malignant neoplasm of breast: Secondary | ICD-10-CM

## 2012-03-19 ENCOUNTER — Encounter (HOSPITAL_COMMUNITY): Payer: Self-pay

## 2012-03-19 ENCOUNTER — Emergency Department (HOSPITAL_COMMUNITY): Payer: Medicare Other

## 2012-03-19 ENCOUNTER — Emergency Department (HOSPITAL_COMMUNITY)
Admission: EM | Admit: 2012-03-19 | Discharge: 2012-03-19 | Disposition: A | Discharge status: Home / Self Care | Payer: Medicare Other | Attending: Emergency Medicine | Admitting: Emergency Medicine

## 2012-03-19 DIAGNOSIS — Z8619 Personal history of other infectious and parasitic diseases: Secondary | ICD-10-CM | POA: Insufficient documentation

## 2012-03-19 DIAGNOSIS — S42209A Unspecified fracture of upper end of unspecified humerus, initial encounter for closed fracture: Secondary | ICD-10-CM | POA: Diagnosis present

## 2012-03-19 DIAGNOSIS — X58XXXA Exposure to other specified factors, initial encounter: Secondary | ICD-10-CM | POA: Insufficient documentation

## 2012-03-19 DIAGNOSIS — Y939 Activity, unspecified: Secondary | ICD-10-CM | POA: Insufficient documentation

## 2012-03-19 DIAGNOSIS — Y929 Unspecified place or not applicable: Secondary | ICD-10-CM | POA: Insufficient documentation

## 2012-03-19 HISTORY — DX: Other bacterial infections of unspecified site: A49.8

## 2012-03-19 MED ORDER — ACETAMINOPHEN-CODEINE #3 300-30 MG PO TABS: 1.0000 | ORAL_TABLET | ORAL | Status: DC | PRN

## 2012-03-19 MED ORDER — ACETAMINOPHEN-CODEINE #3 300-30 MG PO TABS
ORAL_TABLET | ORAL | Status: AC
  Administered 2012-03-19: 1 via ORAL
  Filled 2012-03-19: qty 1

## 2012-03-19 MED ORDER — PROMETHAZINE HCL 12.5 MG PO TABS: 12.5000 mg | ORAL_TABLET | ORAL | Status: DC | PRN

## 2012-03-19 MED ORDER — PROMETHAZINE HCL 12.5 MG PO TABS
ORAL_TABLET | ORAL | Status: AC
  Administered 2012-03-19: 12.5 mg via ORAL
  Filled 2012-03-19: qty 1

## 2012-03-19 MED ORDER — PROMETHAZINE HCL 12.5 MG PO TABS
12.5000 mg | ORAL_TABLET | Freq: Once | ORAL | Status: AC
  Administered 2012-03-19: 12.5 mg via ORAL

## 2012-03-19 MED ORDER — ACETAMINOPHEN-CODEINE #3 300-30 MG PO TABS
1.0000 | ORAL_TABLET | Freq: Once | ORAL | Status: AC
  Administered 2012-03-19: 1 via ORAL

## 2012-03-19 NOTE — H&P (Signed)
[name] is an 78 y.o. female.   Chief Complaint: right shoulder pain  HPI: 78 yo female fell at [facility] c/o severe right shoulder pain with mild tingling in the right hand   Past Medical History  Diagnosis Date  . Clostridium difficile infection     Past Surgical History  Procedure Date  . Cholecystectomy   . Abdominal hysterectomy   . Foot surgery     rt    No family history on file. Social History:  reports that she has never smoked. She does not have any smokeless tobacco history on file. She reports that she does not drink alcohol or use illicit drugs.  Allergies: No Known Allergies   (Not in a hospital admission)  No results found for this or any previous visit (from the past 48 hour(s)). No results found.  Review of Systems  Gastrointestinal: Positive for nausea.  Neurological: Positive for tingling.  All other systems reviewed and are negative.    Blood pressure 173/75, pulse 82, temperature 97.7 F (36.5 C), temperature source Oral, resp. rate 16, weight 80.74 kg (178 lb), SpO2 95.00%. Physical Exam  Constitutional: She is oriented to person, place, and time. She appears well-developed and well-nourished.  HENT:  Head: Normocephalic.  Eyes: Pupils are equal, round, and reactive to light.  Neck: Normal range of motion.  Cardiovascular: Normal rate and intact distal pulses.   Respiratory: Effort normal.  GI: Soft.  Musculoskeletal:       Right shoulder: She exhibits decreased range of motion, tenderness, bony tenderness, swelling, crepitus, deformity, pain and decreased strength. She exhibits no effusion, no laceration, no spasm and normal pulse.       Right hip: Normal.       Left hip: Normal.  Lymphadenopathy:    She has no cervical adenopathy.  Neurological: She is alert and oriented to person, place, and time. She has normal reflexes. She exhibits normal muscle tone. Coordination normal.  Skin: Skin is warm and dry.  Psychiatric: She  has a normal mood and affect. Her behavior is normal. Judgment and thought content normal.    X-rays neg humerus   Positive proximal humerus fracture right shoulder  Assessment/Plan Proximal humerus fracture right shoulder   Shoulder immobilizer   discharge home   Fuller Canada 03/19/2012, 10:50 AM

## 2012-03-19 NOTE — ED Notes (Addendum)
Pt at church today and fall walking up steps. Fell on rt shoulder. C/o rt shoulder pain. Dr. Romeo Apple called and ordered 2 xrays for pt. - Dr. Romeo Apple to see pt

## 2012-03-27 ENCOUNTER — Ambulatory Visit (INDEPENDENT_AMBULATORY_CARE_PROVIDER_SITE_OTHER): Payer: Medicare Other

## 2012-03-27 ENCOUNTER — Ambulatory Visit (INDEPENDENT_AMBULATORY_CARE_PROVIDER_SITE_OTHER): Payer: Medicare Other | Admitting: Orthopedic Surgery

## 2012-03-27 ENCOUNTER — Encounter: Payer: Self-pay | Admitting: Orthopedic Surgery

## 2012-03-27 VITALS — Ht 65.0 in | Wt 178.0 lb

## 2012-03-27 DIAGNOSIS — S42209A Unspecified fracture of upper end of unspecified humerus, initial encounter for closed fracture: Secondary | ICD-10-CM

## 2012-03-27 NOTE — Patient Instructions (Addendum)
Sling - continue   Sleep where its comfortable

## 2012-03-27 NOTE — Progress Notes (Signed)
Patient ID: [name], female   DOB: [birth year], 78 y.o.   MRN: [id number] Chief Complaint  Patient presents with  . Shoulder Pain    Right shoulder fracture. DOI 03-19-12.    1. Proximal humerus fracture  DG Shoulder Right  2. Fracture of proximal humerus       X-ray today shows the anterior posterior view shows impaction of the humeral head with prominence of the greater tuberosity. On the lateral view there is a 45 of angulation posteriorly of the head fragment.  A blister was evacuated from the chest wall.  Sterile dressing applied.  Neurovascular exam intact  Recommend sling, x-ray in one week if x-rays show stable fracture physical therapy can be arranged

## 2012-03-29 ENCOUNTER — Encounter (EMERGENCY_DEPARTMENT_HOSPITAL): Payer: Medicare Other | Admitting: Orthopedic Surgery

## 2012-03-29 DIAGNOSIS — S42209A Unspecified fracture of upper end of unspecified humerus, initial encounter for closed fracture: Secondary | ICD-10-CM

## 2012-04-04 ENCOUNTER — Ambulatory Visit (INDEPENDENT_AMBULATORY_CARE_PROVIDER_SITE_OTHER): Payer: Medicare Other | Admitting: Orthopedic Surgery

## 2012-04-04 ENCOUNTER — Ambulatory Visit (INDEPENDENT_AMBULATORY_CARE_PROVIDER_SITE_OTHER): Payer: Medicare Other

## 2012-04-04 ENCOUNTER — Encounter: Payer: Self-pay | Admitting: Orthopedic Surgery

## 2012-04-04 VITALS — Ht 65.0 in | Wt 180.0 lb

## 2012-04-04 DIAGNOSIS — S42209A Unspecified fracture of upper end of unspecified humerus, initial encounter for closed fracture: Secondary | ICD-10-CM

## 2012-04-04 NOTE — Patient Instructions (Signed)
Continue sling  

## 2012-04-04 NOTE — Progress Notes (Signed)
Patient ID: [name], female   DOB: December 01, 1930, 78 y.o.   MRN: [id number] Chief Complaint  Patient presents with  . Shoulder Pain    right shoulder fracture proximal humerus / oct 27th DOI     16 days post right proximal humerus fracture  Followup x-ray today  Patient has pain when attempting to wash under her arm otherwise doing well in a sling-and-swathe  Neurovascular exam in the hand looks great.  X-rays show fracture position is acceptable for nonoperative treatment per the knee or criteria  Patient is advised to continue sling wear and return in one week for x-ray and then we will make physical therapy orders  Patient wants to have or therapy in [place] near her home

## 2012-04-11 ENCOUNTER — Encounter: Payer: Self-pay | Admitting: Orthopedic Surgery

## 2012-04-11 ENCOUNTER — Ambulatory Visit (INDEPENDENT_AMBULATORY_CARE_PROVIDER_SITE_OTHER): Payer: Medicare Other

## 2012-04-11 ENCOUNTER — Ambulatory Visit (INDEPENDENT_AMBULATORY_CARE_PROVIDER_SITE_OTHER): Payer: Medicare Other | Admitting: Orthopedic Surgery

## 2012-04-11 VITALS — Ht 65.0 in | Wt 180.0 lb

## 2012-04-11 DIAGNOSIS — S42209A Unspecified fracture of upper end of unspecified humerus, initial encounter for closed fracture: Secondary | ICD-10-CM

## 2012-04-11 NOTE — Patient Instructions (Addendum)
[facility] [place]

## 2012-04-11 NOTE — Progress Notes (Signed)
Patient ID: [name], female   DOB: June 28, 1930, 78 y.o.   MRN: [id number] Shoulder Pain        right shoulder fracture proximal humerus / oct 27th DOI      23 days post right proximal humerus fracture  Followup x-ray today   Neurovascular exam NORMAL   X-rays show fracture position is acceptable for nonoperative treatment per the knee or criteria  Right shoulder fracture. DOI 03-19-12.  XRAYS NO CHANGE NEER CRITERIA MET FOR NON OP TREATMENT

## 2012-04-13 ENCOUNTER — Ambulatory Visit: Payer: Medicare Other | Attending: Orthopedic Surgery | Admitting: Physical Therapy

## 2012-04-13 DIAGNOSIS — M25519 Pain in unspecified shoulder: Secondary | ICD-10-CM | POA: Insufficient documentation

## 2012-04-13 DIAGNOSIS — M25619 Stiffness of unspecified shoulder, not elsewhere classified: Secondary | ICD-10-CM | POA: Insufficient documentation

## 2012-04-13 DIAGNOSIS — M6281 Muscle weakness (generalized): Secondary | ICD-10-CM | POA: Insufficient documentation

## 2012-04-13 DIAGNOSIS — IMO0001 Reserved for inherently not codable concepts without codable children: Secondary | ICD-10-CM | POA: Insufficient documentation

## 2012-04-18 ENCOUNTER — Ambulatory Visit: Payer: Medicare Other | Admitting: Physical Therapy

## 2012-04-25 ENCOUNTER — Ambulatory Visit: Payer: Medicare Other | Attending: Orthopedic Surgery | Admitting: Physical Therapy

## 2012-04-25 DIAGNOSIS — M25619 Stiffness of unspecified shoulder, not elsewhere classified: Secondary | ICD-10-CM | POA: Insufficient documentation

## 2012-04-25 DIAGNOSIS — IMO0001 Reserved for inherently not codable concepts without codable children: Secondary | ICD-10-CM | POA: Insufficient documentation

## 2012-04-25 DIAGNOSIS — M6281 Muscle weakness (generalized): Secondary | ICD-10-CM | POA: Insufficient documentation

## 2012-04-25 DIAGNOSIS — M25519 Pain in unspecified shoulder: Secondary | ICD-10-CM | POA: Insufficient documentation

## 2012-04-27 ENCOUNTER — Ambulatory Visit: Payer: Medicare Other | Admitting: Physical Therapy

## 2012-05-02 ENCOUNTER — Ambulatory Visit: Payer: Medicare Other | Admitting: Physical Therapy

## 2012-05-04 ENCOUNTER — Ambulatory Visit: Payer: Medicare Other | Admitting: Physical Therapy

## 2012-05-09 ENCOUNTER — Ambulatory Visit: Payer: Medicare Other | Admitting: Physical Therapy

## 2012-05-11 ENCOUNTER — Ambulatory Visit: Payer: Medicare Other | Admitting: Physical Therapy

## 2012-05-15 ENCOUNTER — Ambulatory Visit: Payer: Medicare Other | Admitting: Physical Therapy

## 2012-05-22 ENCOUNTER — Ambulatory Visit: Payer: Medicare Other | Admitting: Physical Therapy

## 2012-05-23 ENCOUNTER — Ambulatory Visit (INDEPENDENT_AMBULATORY_CARE_PROVIDER_SITE_OTHER): Payer: Medicare Other | Admitting: Orthopedic Surgery

## 2012-05-23 VITALS — BP 130/80 | Ht 65.0 in | Wt 180.0 lb

## 2012-05-23 DIAGNOSIS — S42209A Unspecified fracture of upper end of unspecified humerus, initial encounter for closed fracture: Secondary | ICD-10-CM

## 2012-05-23 NOTE — Patient Instructions (Addendum)
Continue home exercises.   Call office at the end of the month.

## 2012-05-23 NOTE — Progress Notes (Signed)
Patient ID: [name], female   DOB: 03/07/1931, 78 y.o.   MRN: [id number] Chief Complaint  Patient presents with  . Follow-up    7 week recheck on right proximal humerus fracture to check ROM.     Followup right proximal humerus fracture doing well progressing with therapy, on her way to [place]. Passive range of motion in the office today 80 of abduction 80 of flexion 40 of external rotation 30 of extension for elevation of 90  Recommend continue exercises at home call the office at the end of the month to discuss a formal physical therapy as needed  Her therapy options are scanned into the documents section.

## 2012-06-29 ENCOUNTER — Telehealth: Payer: Self-pay | Admitting: Orthopedic Surgery

## 2012-06-29 NOTE — Telephone Encounter (Signed)
[name] said you wanted her to let you know how she is doing with her therapy.  She is doing well, still doing her exercises at home (they are in [place] now).  She says she still does not have complete ROM.  Asked if you will call her and let her know how much longer  she needs to do the HEP. HER # [phone]

## 2012-07-11 NOTE — Telephone Encounter (Signed)
Call returned.

## 2012-08-30 ENCOUNTER — Ambulatory Visit: Payer: Medicare Other | Admitting: Orthopedic Surgery

## 2012-09-04 ENCOUNTER — Other Ambulatory Visit: Payer: Self-pay

## 2012-09-04 DIAGNOSIS — Z1231 Encounter for screening mammogram for malignant neoplasm of breast: Secondary | ICD-10-CM

## 2012-11-14 ENCOUNTER — Ambulatory Visit
Admission: RE | Admit: 2012-11-14 | Discharge: 2012-11-14 | Disposition: A | Discharge status: Home / Self Care | Payer: Medicare Other | Source: Ambulatory Visit

## 2012-11-14 DIAGNOSIS — Z1231 Encounter for screening mammogram for malignant neoplasm of breast: Secondary | ICD-10-CM

## 2013-03-21 ENCOUNTER — Telehealth: Payer: Self-pay | Admitting: Orthopedic Surgery

## 2013-03-21 NOTE — Telephone Encounter (Signed)
Disregard previous note, received a cancellation for 04/22/13, patient aware to come by 10:45

## 2013-03-21 NOTE — Telephone Encounter (Signed)
[name] called to request an appointment  for left hip/leg soreness after a fall 03/12/13.  First time  I have is the double book 9:00 slot on 04/04/13. She has not had any treatment, will need xrays.  Is 04/04/13 ok with you?  Just wanted you to be aware in case she should call you.   Marland Kitchen

## 2013-03-22 ENCOUNTER — Ambulatory Visit (INDEPENDENT_AMBULATORY_CARE_PROVIDER_SITE_OTHER): Payer: Medicare Other

## 2013-03-22 ENCOUNTER — Ambulatory Visit (INDEPENDENT_AMBULATORY_CARE_PROVIDER_SITE_OTHER): Payer: Medicare Other | Admitting: Orthopedic Surgery

## 2013-03-22 VITALS — BP 145/85 | Ht 65.0 in | Wt 183.0 lb

## 2013-03-22 DIAGNOSIS — M549 Dorsalgia, unspecified: Secondary | ICD-10-CM

## 2013-03-22 DIAGNOSIS — M25559 Pain in unspecified hip: Secondary | ICD-10-CM

## 2013-03-22 DIAGNOSIS — M25552 Pain in left hip: Secondary | ICD-10-CM

## 2013-03-22 NOTE — Patient Instructions (Signed)
Take Aleve twice a day

## 2013-03-22 NOTE — Progress Notes (Signed)
Patient ID: [name], female   DOB: January 22, 1931, 79 y.o.   MRN: [id number]  Chief Complaint  Patient presents with  . Hip Pain    Left hip pain, DOI 03-12-13.    The patient was walking out of a door at a lot of things in her hand tripped landed on her left side injuring her left hip. Injury was about 2 or so weeks ago. She tried to manage it herself but persistent limping and pain lumbar spine and left buttock and lateral hip brought her in for evaluation and treatment. Denies numbness or tingling in the left leg  History of scoliosis in the lumbar spine long term.  Review of systems negative  Examination BP 145/85  Ht 5\' 5"  (1.651 m)  Wt 183 lb (83.008 kg)  BMI 30.45 kg/m2  General appearance is normal, the patient is alert and oriented x3 with normal mood and affect.  She is ambulatory no assisted devices slight left lower extremity limp  Tenderness in her lumbar spine increase muscle tension they are decreased range of motion painful range of motion normal range of motion of the hip with tenderness in the left buttock left greater trochanter  Distal neurovascular function intact including normal reflexes. Motor exam normal  X-ray showed no hip fracture mild arthritis X-ray spine shows degenerative scoliosis no fracture  Impression irritation sciatic nerve, back pain, recommend IM shot of steroid 40 mg Depo-Medrol, Aleve twice a day followup 2 weeks  Intramuscular injection left hip 40 mg of Depo-Medrol 1% lidocaine and 3 cc  Verbal consent Timeout  Injection IM left hip

## 2013-04-05 ENCOUNTER — Ambulatory Visit (INDEPENDENT_AMBULATORY_CARE_PROVIDER_SITE_OTHER): Payer: Medicare Other | Admitting: Orthopedic Surgery

## 2013-04-05 VITALS — BP 146/92 | Ht 65.0 in | Wt 183.0 lb

## 2013-04-05 DIAGNOSIS — M5137 Other intervertebral disc degeneration, lumbosacral region: Secondary | ICD-10-CM

## 2013-04-05 NOTE — Progress Notes (Signed)
Patient ID: [name], female   DOB: 02-02-1931, 79 y.o.   MRN: [id number] Chief Complaint  Patient presents with  . Follow-up    2 week recheck left leg and back pain    HISTORY: This patient presented a few weeks ago with left hip pain after falling. X-ray showed mild arthritis of the hip and degenerative scoliosis of the spine she was treated with an IM injection and she presents back with less pain in her leg and hip but is persistent pain in her back. Of note she has a history of lumbar spine pain  She's walking a little bit better would like to see that improve like to see her before she goes back to [place] for the winter

## 2013-05-22 ENCOUNTER — Ambulatory Visit: Payer: BC Managed Care – PPO | Admitting: Orthopedic Surgery

## 2013-10-09 ENCOUNTER — Other Ambulatory Visit: Payer: Self-pay

## 2013-10-09 DIAGNOSIS — Z1231 Encounter for screening mammogram for malignant neoplasm of breast: Secondary | ICD-10-CM

## 2013-11-15 ENCOUNTER — Ambulatory Visit
Admission: RE | Admit: 2013-11-15 | Discharge: 2013-11-15 | Disposition: A | Discharge status: Home / Self Care | Payer: Medicare Other | Source: Ambulatory Visit

## 2013-11-15 DIAGNOSIS — Z1231 Encounter for screening mammogram for malignant neoplasm of breast: Secondary | ICD-10-CM

## 2013-12-05 ENCOUNTER — Ambulatory Visit (INDEPENDENT_AMBULATORY_CARE_PROVIDER_SITE_OTHER): Payer: Medicare Other | Admitting: Internal Medicine

## 2013-12-05 ENCOUNTER — Encounter: Payer: Self-pay | Admitting: Internal Medicine

## 2013-12-05 ENCOUNTER — Encounter (INDEPENDENT_AMBULATORY_CARE_PROVIDER_SITE_OTHER): Payer: Self-pay

## 2013-12-05 ENCOUNTER — Ambulatory Visit (INDEPENDENT_AMBULATORY_CARE_PROVIDER_SITE_OTHER)
Admission: RE | Admit: 2013-12-05 | Discharge: 2013-12-05 | Disposition: A | Discharge status: Home / Self Care | Payer: Medicare Other | Source: Ambulatory Visit | Attending: Internal Medicine | Admitting: Internal Medicine

## 2013-12-05 VITALS — BP 130/88 | HR 76 | Temp 97.7°F | Ht 65.0 in | Wt 186.0 lb

## 2013-12-05 DIAGNOSIS — R05 Cough: Secondary | ICD-10-CM

## 2013-12-05 DIAGNOSIS — R059 Cough, unspecified: Secondary | ICD-10-CM

## 2013-12-05 DIAGNOSIS — R058 Other specified cough: Secondary | ICD-10-CM

## 2013-12-05 MED ORDER — FAMOTIDINE 20 MG PO TABS: ORAL_TABLET | ORAL | Status: DC

## 2013-12-05 MED ORDER — ACETAMINOPHEN-CODEINE #3 300-30 MG PO TABS: 1.0000 | ORAL_TABLET | ORAL | Status: DC | PRN

## 2013-12-05 MED ORDER — PREDNISONE 10 MG PO TABS
ORAL_TABLET | ORAL | Status: DC
Start: 2013-12-05 — End: 2014-10-23

## 2013-12-05 MED ORDER — PANTOPRAZOLE SODIUM 40 MG PO TBEC: 40.0000 mg | DELAYED_RELEASE_TABLET | Freq: Every day | ORAL | Status: DC

## 2013-12-05 NOTE — Progress Notes (Signed)
Quick Note:  LMTCB ______ 

## 2013-12-05 NOTE — Progress Notes (Signed)
   Subjective:    Patient ID: [name], female    DOB: 08/19/1930  MRN: [id number]  HPI   [age] yowf never smoker never resp problems until fall 2014 abruptly ill with cough ? Related to cold coughed ever since esp at hs referred to pulmonary clinic 12/05/2013 by Dr Wynelle LinkSun   12/05/2013 1st [facility] office visit/ [name]  Chief Complaint  Patient presents with  . Pulmonary Consult    Referred per Dr. Wynelle LinkSun. Pt c/o cough since Fall 2014.  She states cough is non prod and esp worse at night.   dry cough daily since Oct 2014 waxes and wanes worse at hs no better with cough drops  Not limited by breathing from desired activities   No obvious other patterns in day to day or daytime variabilty or assoc  cp or chest tightness, subjective wheeze overt sinus or hb symptoms. No unusual exp hx or h/o childhood pna/ asthma or knowledge of premature birth.  Sleeping ok without nocturnal  or early am exacerbation  of respiratory  c/o's or need for noct saba. Also denies any obvious fluctuation of symptoms with weather or environmental changes or other aggravating or alleviating factors except as outlined above   Current Medications, Allergies, Complete Past Medical History, Past Surgical History, Family History, and Social History were reviewed in [facility] electronic medical record.                Review of Systems  Constitutional: Negative for fever, chills and unexpected weight change.  HENT: Negative for congestion, dental problem, ear pain, nosebleeds, postnasal drip, rhinorrhea, sinus pressure, sneezing, sore throat, trouble swallowing and voice change.   Eyes: Negative for visual disturbance.  Respiratory: Positive for cough. Negative for choking and shortness of breath.   Cardiovascular: Negative for chest pain and leg swelling.  Gastrointestinal: Negative for vomiting, abdominal pain and diarrhea.  Genitourinary: Negative for difficulty urinating.  Musculoskeletal: Negative  for arthralgias.  Skin: Negative for rash.  Neurological: Negative for tremors, syncope and headaches.  Hematological: Does not bruise/bleed easily.       Objective:   Physical Exam  Wt Readings from Last 3 Encounters:  12/05/13 186 lb (84.369 kg)  04/05/13 183 lb (83.008 kg)  03/22/13 183 lb (83.008 kg)      HEENT: nl dentition, turbinates, and orophanx. Nl external ear canals without cough reflex   NECK :  without JVD/Nodes/TM/ nl carotid upstrokes bilaterally   LUNGS: no acc muscle use, clear to A and P bilaterally without cough on insp or exp maneuvers   CV:  RRR  no s3 or murmur or increase in P2, no edema   ABD:  soft and nontender with nl excursion in the supine position. No bruits or organomegaly, bowel sounds nl  MS:  warm without deformities, calf tenderness, cyanosis or clubbing  SKIN: warm and dry without lesions    NEURO:  alert, approp, no deficits    CXR  12/05/2013 :   Lungs are clear. Heart size is normal. The aorta is mildly  torturous. There is no pneumothorax or pleural effusion.      Assessment & Plan:

## 2013-12-05 NOTE — Patient Instructions (Signed)
The key to effective treatment for your cough is eliminating the non-stop cycle of cough you're stuck in long enough to let your airway heal completely and then see if there is anything still making you cough once you stop the cough suppression, but this should take no more than 5 days to figure out  First take delsym two tsp every 12 hours and supplement if needed with tylenol #3  up to 1 every 4 hours to suppress the urge to cough at all or even clear your throat. Swallowing water or using ice chips/non mint and menthol containing candies (such as lifesavers or sugarless jolly ranchers) are also effective.  You should rest your voice and avoid activities that you know make you cough.  Once you have eliminated the cough for 3 straight days try reducing the tylenol #3  first,  then the delsym as tolerated.    Prednisone 10 mg take  4 each am x 2 days,   2 each am x 2 days,  1 each am x 2 days and stop (this is to eliminate allergies and inflammation from coughing)  Protonix (pantoprazole) Take 30-60 min before first meal of the day and Pepcid 20 mg one bedtime plus chlorpheniramine 4 mg x 2 at bedtime (both available over the counter)  until cough is completely gone for at least a week without the need for cough suppression  GERD (REFLUX)  is an extremely common cause of respiratory symptoms, many times with no significant heartburn at all.    It can be treated with medication, but also with lifestyle changes including avoidance of late meals, excessive alcohol, smoking cessation, and avoid fatty foods, chocolate, peppermint, colas, red wine, and acidic juices such as orange juice.  NO MINT OR MENTHOL PRODUCTS SO NO COUGH DROPS  USE HARD CANDY INSTEAD (jolley ranchers or Stover's or Lifesavers (all available in sugarless versions) NO OIL BASED VITAMINS - use powdered substitutes.   Please remember to go to the  x-ray department downstairs for your tests - we will call you with the results when they  are available.

## 2013-12-05 NOTE — Assessment & Plan Note (Signed)

## 2013-12-07 ENCOUNTER — Telehealth: Payer: Self-pay | Admitting: Internal Medicine

## 2013-12-07 NOTE — Progress Notes (Signed)
Quick Note:  LMTCB ______ 

## 2013-12-07 NOTE — Telephone Encounter (Signed)
Spoke with the pt and notified of results of her cxr  She verbalized understanding and nothing further needed 

## 2013-12-11 NOTE — Progress Notes (Signed)
Quick Note:  Spoke with pt and notified of results per Dr. Wert. Pt verbalized understanding and denied any questions.  ______ 

## 2014-05-13 ENCOUNTER — Other Ambulatory Visit: Payer: Self-pay | Admitting: Internal Medicine

## 2014-10-15 ENCOUNTER — Other Ambulatory Visit: Payer: Self-pay

## 2014-10-15 DIAGNOSIS — Z1231 Encounter for screening mammogram for malignant neoplasm of breast: Secondary | ICD-10-CM

## 2014-10-23 ENCOUNTER — Ambulatory Visit (INDEPENDENT_AMBULATORY_CARE_PROVIDER_SITE_OTHER): Payer: Medicare Other | Admitting: Internal Medicine

## 2014-10-23 ENCOUNTER — Encounter: Payer: Self-pay | Admitting: Internal Medicine

## 2014-10-23 VITALS — BP 138/84 | HR 81 | Ht 64.0 in | Wt 186.0 lb

## 2014-10-23 DIAGNOSIS — R058 Other specified cough: Secondary | ICD-10-CM

## 2014-10-23 DIAGNOSIS — R05 Cough: Secondary | ICD-10-CM

## 2014-10-23 MED ORDER — FAMOTIDINE 20 MG PO TABS: ORAL_TABLET | ORAL | Status: DC

## 2014-10-23 MED ORDER — PANTOPRAZOLE SODIUM 40 MG PO TBEC: 40.0000 mg | DELAYED_RELEASE_TABLET | Freq: Every day | ORAL | Status: DC

## 2014-10-23 MED ORDER — PREDNISONE 10 MG PO TABS: ORAL_TABLET | ORAL | Status: DC

## 2014-10-23 NOTE — Patient Instructions (Addendum)
The key to effective treatment for your cough is eliminating the non-stop cycle of cough you're stuck in long enough to let your airway heal completely and then see if there is anything still making you cough once you stop the cough suppression, but this should take no more than 5 days to figure out  First take delsym two tsp every 12 hours - Swallowing water or using ice chips/non mint and menthol containing candies (such as lifesavers or sugarless jolly ranchers) are also effective.  You should rest your voice and avoid activities that you know make you cough.  Prednisone 10 mg take  4 each am x 2 days,   2 each am x 2 days,  1 each am x 2 days and stop (this is to eliminate allergies and inflammation from coughing)  Protonix (pantoprazole) Take 30-60 min before first meal of the day and Pepcid 20 mg one bedtime plus chlorpheniramine 4 mg x 2 at bedtime (both available over the counter)  until cough is completely gone for at least a week without the need for cough suppression  GERD (REFLUX)  is an extremely common cause of respiratory symptoms, many times with no significant heartburn at all.    It can be treated with medication, but also with lifestyle changes including avoidance of late meals, excessive alcohol, smoking cessation, and avoid fatty foods, chocolate, peppermint, colas, red wine, and acidic juices such as orange juice.  NO MINT OR MENTHOL PRODUCTS SO NO COUGH DROPS  USE HARD CANDY INSTEAD (jolley ranchers or Stover's or Lifesavers (all available in sugarless versions) NO OIL BASED VITAMINS - use powdered substitutes  Please schedule a follow up office visit in 4 weeks, sooner if needed  - may need rechallenge with neurontin fo rirritable larynx syndrome p methacholine challenge

## 2014-10-23 NOTE — Progress Notes (Signed)
Subjective:    Patient ID: [name], female    DOB: [birth year]  MRN: [id number]     Brief patient profile:  78 yowf never smoker never resp problems until fall 2014 abruptly ill with cough ? Related to cold coughed ever since esp at hs referred to pulmonary clinic 12/05/2013 by Dr Wynelle Link   History of Present Illness  12/05/2013 1st [facility] office visit/ Giannis Corpuz  Chief Complaint  Patient presents with  . Pulmonary Consult    Referred per Dr. Wynelle Link. Pt c/o cough since Fall 2014.  She states cough is non prod and esp worse at night.   dry cough daily since Oct 2014 waxes and wanes worse at hs no better with cough drops rec The key to effective treatment for your cough is eliminating the non-stop cycle of cough  First take delsym two tsp every 12 hours and supplement if needed with tylenol #3  up to 1 every 4 hours   Once you have eliminated the cough for 3 straight days try reducing the tylenol #3  first,  then the delsym as tolerated.   Prednisone 10 mg take  4 each am x 2 days,   2 each am x 2 days,  1 each am x 2 days and stop (this is to eliminate allergies and inflammation from coughing) Protonix (pantoprazole) Take 30-60 min before first meal of the day and Pepcid 20 mg one bedtime plus chlorpheniramine 4 mg x 2 at bedtime (both available over the counter)  until cough is completely gone for at least a week without the need for cough suppression GERD diet    10/23/2014 f/u ov/Kahla Risdon re: recurrent cough  Chief Complaint  Patient presents with  . Follow-up    Pt states that her cough started back in Nov 2015- non prod. She is having some PND.   cough is dry, somewhat worse at hs but also bothers during the day some every day - had improved from last ov but indolent onset persistent cough since Nov 2015 not on ppi as maintenance rx - sense of pnds but no excess mucus production or noct disturbance  - no response to otcs  No obvious day to day or daytime variabilty or assoc sob   or cp or chest tightness, subjective wheeze overt sinus or hb symptoms. No unusual exp hx or h/o childhood pna/ asthma or knowledge of premature birth.  Sleeping ok without nocturnal  or early am exacerbation  of respiratory  c/o's or need for noct saba. Also denies any obvious fluctuation of symptoms with weather or environmental changes or other aggravating or alleviating factors except as outlined above   Current Medications, Allergies, Complete Past Medical History, Past Surgical History, Family History, and Social History were reviewed in [employer] record.  ROS  The following are not active complaints unless bolded sore throat, dysphagia, dental problems, itching, sneezing,  nasal congestion or excess/ purulent secretions, ear ache,   fever, chills, sweats, unintended wt loss, pleuritic or exertional cp, hemoptysis,  orthopnea pnd or leg swelling, presyncope, palpitations, heartburn, abdominal pain, anorexia, nausea, vomiting, diarrhea  or change in bowel or urinary habits, change in stools or urine, dysuria,hematuria,  rash, arthralgias, visual complaints, headache, numbness weakness or ataxia or problems with walking or coordination,  change in mood/affect or memory.            Objective:   Physical Exam  amb wf nad/ vital signs reviewed   10/23/2014  186 Wt Readings from Last 3 Encounters:  12/05/13 186 lb (84.369 kg)  04/05/13 183 lb (83.008 kg)  03/22/13 183 lb (83.008 kg)      HEENT: nl dentition, turbinates, and orophanx which is pristine. Nl external ear canals without cough reflex   NECK :  without JVD/Nodes/TM/ nl carotid upstrokes bilaterally   LUNGS: no acc muscle use, clear to A and P bilaterally without cough on insp or exp maneuvers   CV:  RRR  no s3 or murmur or increase in P2, no edema   ABD:  soft and nontender with nl excursion in the supine position. No bruits or organomegaly, bowel sounds nl  MS:  warm without deformities,  calf tenderness, cyanosis or clubbing  SKIN: warm and dry without lesions    NEURO:  alert, approp, no deficits    CXR  12/05/2013 :   Lungs are clear. Heart size is normal. The aorta is mildly  torturous. There is no pneumothorax or pleural effusion.      Assessment & Plan:

## 2014-10-27 ENCOUNTER — Encounter: Payer: Self-pay | Admitting: Internal Medicine

## 2014-10-27 NOTE — Assessment & Plan Note (Addendum)
-   onset Oct 2014 - cyclical cough regimen 12/05/2013 > resolved    Recurrent cyclical cough typical of  Classic Upper airway cough syndrome, so named because it's frequently impossible to sort out how much is  CR/sinusitis with freq throat clearing (which can be related to primary GERD)   vs  causing  secondary (" extra esophageal")  GERD from wide swings in gastric pressure that occur with throat clearing, often  promoting self use of mint and menthol lozenges that reduce the lower esophageal sphincter tone and exacerbate the problem further in a cyclical fashion.   These are the same pts (now being labeled as having "irritable larynx syndrome" by some cough centers) who not infrequently have a history of having failed to tolerate ace inhibitors,  dry powder inhalers or biphosphonates or report having atypical reflux symptoms that don't respond to standard doses of PPI , and are easily confused as having aecopd or asthma flares by even experienced allergists/ pulmonologists.   rec max rx for gerd/ eliminate cycle of coughng with tramadol. And add 1st gen h2 for pnds as per guidelines   may need rechallenge with neurontin fo rirritable larynx syndrome p methacholine challenge   I had an extended discussion with the patient reviewing all relevant studies completed to date and  lasting 15 to 20 minutes of a 25 minute visit on the following ongoing concerns:   Each maintenance medication was reviewed in detail including most importantly the difference between maintenance and as needed and under what circumstances the prns are to be used.  Please see instructions for details which were reviewed in writing and the patient given a copy.

## 2014-11-19 ENCOUNTER — Ambulatory Visit
Admission: RE | Admit: 2014-11-19 | Discharge: 2014-11-19 | Disposition: A | Discharge status: Home / Self Care | Payer: Medicare Other | Source: Ambulatory Visit

## 2014-11-19 DIAGNOSIS — Z1231 Encounter for screening mammogram for malignant neoplasm of breast: Secondary | ICD-10-CM

## 2014-11-22 ENCOUNTER — Encounter: Payer: Self-pay | Admitting: Internal Medicine

## 2014-11-22 ENCOUNTER — Ambulatory Visit (INDEPENDENT_AMBULATORY_CARE_PROVIDER_SITE_OTHER): Payer: Medicare Other | Admitting: Internal Medicine

## 2014-11-22 VITALS — BP 126/80 | HR 82 | Ht 65.0 in | Wt 187.0 lb

## 2014-11-22 DIAGNOSIS — R05 Cough: Secondary | ICD-10-CM

## 2014-11-22 DIAGNOSIS — R058 Other specified cough: Secondary | ICD-10-CM

## 2014-11-22 NOTE — Patient Instructions (Addendum)
Protonix (pantoprazole) Take 30-60 min before first meal of the day and Pepcid 20 mg one bedtime plus chlorpheniramine 4 mg x1-2 at bedtime (both available over the counter)  until cough is completely gone for at least a week without the need for cough suppression  For drainage take chlortrimeton (chlorpheniramine) 4 mg every 4 hours available over the counter (may cause drowsiness)   GERD (REFLUX)  is an extremely common cause of respiratory symptoms, many times with no significant heartburn at all.    It can be treated with medication, but also with lifestyle changes including avoidance of late meals, excessive alcohol, smoking cessation, and avoid fatty foods, chocolate, peppermint, colas, red wine, and acidic juices such as orange juice.  NO MINT OR MENTHOL PRODUCTS SO NO COUGH DROPS  USE HARD CANDY INSTEAD (jolley ranchers or Stover's or Lifesavers (all available in sugarless versions) NO OIL BASED VITAMINS - use powdered substitutes  If happy in 3 months see Dr Wynelle LinkSun, if not return here

## 2014-11-22 NOTE — Progress Notes (Signed)
Subjective:    Patient ID: [name], female    DOB: 04/17/1931  MRN: [id number]     Brief patient profile:  [age] yowf never smoker never resp problems until fall 2014 abruptly ill with cough ? Related to cold coughed ever since esp at hs referred to pulmonary clinic 12/05/2013 by Dr Wynelle LinkSun   History of Present Illness  12/05/2013 1st [facility] office visit/ [name]  Chief Complaint  Patient presents with  . Pulmonary Consult    Referred per Dr. Wynelle LinkSun. Pt c/o cough since Fall 2014.  She states cough is non prod and esp worse at night.   dry cough daily since Oct 2014 waxes and wanes worse at hs no better with cough drops rec The key to effective treatment for your cough is eliminating the non-stop cycle of cough  First take delsym two tsp every 12 hours and supplement if needed with tylenol #3  up to 1 every 4 hours   Once you have eliminated the cough for 3 straight days try reducing the tylenol #3  first,  then the delsym as tolerated.   Prednisone 10 mg take  4 each am x 2 days,   2 each am x 2 days,  1 each am x 2 days and stop (this is to eliminate allergies and inflammation from coughing) Protonix (pantoprazole) Take 30-60 min before first meal of the day and Pepcid 20 mg one bedtime plus chlorpheniramine 4 mg x 2 at bedtime (both available over the counter)  until cough is completely gone for at least a week without the need for cough suppression GERD diet    10/23/2014 f/u ov/[name] re: recurrent cough  Chief Complaint  Patient presents with  . Follow-up    Pt states that her cough started back in Nov 2015- non prod. She is having some PND.   cough is dry, somewhat worse at hs but also bothers during the day some every day - had improved from last ov but indolent onset persistent cough since Nov 2015 not on ppi as maintenance rx - sense of pnds but no excess mucus production or noct disturbance  - no response to otcs rec First take delsym two tsp every 12 hours - Swallowing  water or using ice chips/non mint and menthol containing candies (such as lifesavers or sugarless jolly ranchers) are also effective.  You should rest your voice and avoid activities that you know make you cough. Prednisone 10 mg take  4 each am x 2 days,   2 each am x 2 days,  1 each am x 2 days and stop (this is to eliminate allergies and inflammation from coughing) Protonix (pantoprazole) Take 30-60 min before first meal of the day and Pepcid 20 mg one bedtime plus chlorpheniramine 4 mg x 2 at bedtime (both available over the counter)  until cough is completely gone for at least a week without the need for cough suppression GERD diet   - may need rechallenge with neurontin fo rirritable larynx syndrome p methacholine challenge   11/22/2014 f/u ov/[name] re: chronic cough resolved on gerd rx  Chief Complaint  Patient presents with  . Follow-up    Pt states that her cough is much improved and is having less PND. No new co's today.      No obvious day to day or daytime variabilty or assoc sob  or cp or chest tightness, subjective wheeze overt sinus or hb symptoms. No unusual exp hx or h/o  childhood pna/ asthma or knowledge of premature birth.  Sleeping ok without nocturnal  or early am exacerbation  of respiratory  c/o's or need for noct saba. Also denies any obvious fluctuation of symptoms with weather or environmental changes or other aggravating or alleviating factors except as outlined above   Current Medications, Allergies, Complete Past Medical History, Past Surgical History, Family History, and Social History were reviewed in [employer] record.  ROS  The following are not active complaints unless bolded sore throat, dysphagia, dental problems, itching, sneezing,  nasal congestion or excess/ purulent secretions, ear ache,   fever, chills, sweats, unintended wt loss, pleuritic or exertional cp, hemoptysis,  orthopnea pnd or leg swelling, presyncope, palpitations,  heartburn, abdominal pain, anorexia, nausea, vomiting, diarrhea  or change in bowel or urinary habits, change in stools or urine, dysuria,hematuria,  rash, arthralgias, visual complaints, headache, numbness weakness or ataxia or problems with walking or coordination,  change in mood/affect or memory.            Objective:   Physical Exam  amb wf nad/ vital signs reviewed  All smiles   10/23/2014          186 > 11/22/2014  187  Wt Readings from Last 3 Encounters:  12/05/13 186 lb (84.369 kg)  04/05/13 183 lb (83.008 kg)  03/22/13 183 lb (83.008 kg)      HEENT: nl dentition, turbinates, and orophanx which is pristine. Nl external ear canals without cough reflex   NECK :  without JVD/Nodes/TM/ nl carotid upstrokes bilaterally   LUNGS: no acc muscle use, clear to A and P bilaterally without cough on insp or exp maneuvers   CV:  RRR  no s3 or murmur or increase in P2, no edema   ABD:  soft and nontender with nl excursion in the supine position. No bruits or organomegaly, bowel sounds nl  MS:  warm without deformities, calf tenderness, cyanosis or clubbing  SKIN: warm and dry without lesions    NEURO:  alert, approp, no deficits    CXR  12/05/2013 : Lungs are clear. Heart size is normal. The aorta is mildly  torturous. There is no pneumothorax or pleural effusion.      Assessment & Plan:

## 2014-11-23 ENCOUNTER — Encounter: Payer: Self-pay | Admitting: Internal Medicine

## 2014-11-23 NOTE — Assessment & Plan Note (Signed)
-   onset Oct 2014 - cyclical cough regimen 12/05/2013 > resolved  - recurred 10/23/14 > resolved again   Not really clear what triggers the cough but once she gets started she has a hard time stopping.     Of the three most common causes of chronic cough, only one (GERD)  can actually cause the other two (asthma and post nasal drip syndrome)  and perpetuate the cylce of cough inducing airway trauma, inflammation, heightened sensitivity to reflux which is prompted by the cough itself via a cyclical mechanism.    This may partially respond to steroids and look like asthma and post nasal drainage but never erradicated completely unless the cough and the secondary reflux are eliminated, preferably both at the same time.  While not intuitively obvious, many patients with chronic low grade reflux do not cough until there is a secondary insult that disturbs the protective epithelial barrier and exposes sensitive nerve endings.  This can be viral or direct physical injury such as with an endotracheal tube.   The point is that once this occurs, it is difficult to eliminate using anything but a maximally effective acid suppression regimen at least in the short run, accompanied by an appropriate diet to address non acid GERD.   For now rec she remain on max gerd rx and use the 1st gen H1's to eliminate pnds > if not controlled add neurontin next/ if doing great perhaps wean the ppi in 3 months (Discussed the recent press about ppi's in the context of a statistically significant (but questionably clinically relevant) increase in CRI in pts on ppi vs h2's > bottom line is the lowest dose of ppi that controls   gerd is the right dose and if that dose is zero that's fine esp since h2's are cheaper).  I had an extended final summary discussion with the patient reviewing all relevant studies completed to date and  lasting 15 to 20 minutes of a 25 minute visit    Each maintenance medication was reviewed in detail  including most importantly the difference between maintenance and as needed and under what circumstances the prns are to be used.  Please see instructions for details which were reviewed in writing and the patient given a copy.  :

## 2015-02-07 ENCOUNTER — Telehealth: Payer: Self-pay | Admitting: Internal Medicine

## 2015-02-07 NOTE — Telephone Encounter (Signed)
Will need f/u ov but in meantime be sure she's taking her ppi bid ac and delsym for cough

## 2015-02-07 NOTE — Telephone Encounter (Signed)
Called pt and she scheduled appt to see MW Tuesday for visit.

## 2015-02-07 NOTE — Telephone Encounter (Signed)
Called pt. She reports her cough has returned. She has a deep, dry cough. No wheezing, no chest tx, no f/c/s/n/v. Wants to know if she can have CXR done? Please advise thanks

## 2015-02-11 ENCOUNTER — Ambulatory Visit (INDEPENDENT_AMBULATORY_CARE_PROVIDER_SITE_OTHER): Payer: Medicare Other | Admitting: Internal Medicine

## 2015-02-11 ENCOUNTER — Encounter: Payer: Self-pay | Admitting: Internal Medicine

## 2015-02-11 ENCOUNTER — Other Ambulatory Visit (INDEPENDENT_AMBULATORY_CARE_PROVIDER_SITE_OTHER): Payer: Medicare Other

## 2015-02-11 VITALS — BP 152/86 | HR 81 | Ht 65.0 in | Wt 187.0 lb

## 2015-02-11 DIAGNOSIS — R05 Cough: Secondary | ICD-10-CM

## 2015-02-11 DIAGNOSIS — R058 Other specified cough: Secondary | ICD-10-CM

## 2015-02-11 DIAGNOSIS — E669 Obesity, unspecified: Secondary | ICD-10-CM | POA: Diagnosis not present

## 2015-02-11 LAB — CBC WITH DIFFERENTIAL/PLATELET
BASOS ABS: 0 10*3/uL (ref 0.0–0.1)
Basophils Relative: 0.2 % (ref 0.0–3.0)
Eosinophils Absolute: 0.2 10*3/uL (ref 0.0–0.7)
Eosinophils Relative: 1.6 % (ref 0.0–5.0)
HCT: 38.5 % (ref 36.0–46.0)
Hemoglobin: 12.6 g/dL (ref 12.0–15.0)
Lymphocytes Relative: 52.3 % — ABNORMAL HIGH (ref 12.0–46.0)
Lymphs Abs: 5.4 10*3/uL — ABNORMAL HIGH (ref 0.7–4.0)
MCHC: 32.9 g/dL (ref 30.0–36.0)
MCV: 94.7 fl (ref 78.0–100.0)
MONO ABS: 0.7 10*3/uL (ref 0.1–1.0)
MONOS PCT: 7 % (ref 3.0–12.0)
NEUTROS PCT: 38.9 % — AB (ref 43.0–77.0)
Neutro Abs: 4 10*3/uL (ref 1.4–7.7)
Platelets: 267 10*3/uL (ref 150.0–400.0)
RBC: 4.06 Mil/uL (ref 3.87–5.11)
RDW: 14.1 % (ref 11.5–15.5)
WBC: 10.3 10*3/uL (ref 4.0–10.5)

## 2015-02-11 MED ORDER — PANTOPRAZOLE SODIUM 40 MG PO TBEC: 40.0000 mg | DELAYED_RELEASE_TABLET | Freq: Every day | ORAL | Status: DC

## 2015-02-11 MED ORDER — PREDNISONE 10 MG PO TABS: ORAL_TABLET | ORAL | Status: DC

## 2015-02-11 MED ORDER — FAMOTIDINE 20 MG PO TABS: ORAL_TABLET | ORAL | Status: AC

## 2015-02-11 NOTE — Assessment & Plan Note (Signed)
Body mass index is 31.12 kg/(m^2).  Lab Results  Component Value Date   TSH 1.094 08/21/2010     Contributing to gerd tendency/ doe/reviewed the need and the process to achieve and maintain neg calorie balance > defer f/u primary care including intermittently monitoring thyroid status

## 2015-02-11 NOTE — Progress Notes (Signed)
Subjective:    Patient ID: [name], female    DOB: 03-27-1931  MRN: [id number]     Brief patient profile:  88  yowf never smoker never resp problems until fall 2014 abruptly ill with cough ? Related to cold coughed ever since esp at hs referred to pulmonary clinic 12/05/2013 by Dr Wynelle Link   History of Present Illness  12/05/2013 1st [facility] office visit/ Wert  Chief Complaint  Patient presents with  . Pulmonary Consult    Referred per Dr. Wynelle Link. Pt c/o cough since Fall 2014.  She states cough is non prod and esp worse at night.   dry cough daily since Oct 2014 waxes and wanes worse at hs no better with cough drops rec The key to effective treatment for your cough is eliminating the non-stop cycle of cough  First take delsym two tsp every 12 hours and supplement if needed with tylenol #3  up to 1 every 4 hours   Once you have eliminated the cough for 3 straight days try reducing the tylenol #3  first,  then the delsym as tolerated.   Prednisone 10 mg take  4 each am x 2 days,   2 each am x 2 days,  1 each am x 2 days and stop (this is to eliminate allergies and inflammation from coughing) Protonix (pantoprazole) Take 30-60 min before first meal of the day and Pepcid 20 mg one bedtime plus chlorpheniramine 4 mg x 2 at bedtime (both available over the counter)  until cough is completely gone for at least a week without the need for cough suppression GERD diet    10/23/2014 f/u ov/Wert re: recurrent cough  Chief Complaint  Patient presents with  . Follow-up    Pt states that her cough started back in Nov 2015- non prod. She is having some PND.   cough is dry, somewhat worse at hs but also bothers during the day some every day - had improved from last ov but indolent onset persistent cough since Nov 2015 not on ppi as maintenance rx - sense of pnds but no excess mucus production or noct disturbance  - no response to otcs rec First take delsym two tsp every 12 hours - Swallowing  water or using ice chips/non mint and menthol containing candies (such as lifesavers or sugarless jolly ranchers) are also effective.  You should rest your voice and avoid activities that you know make you cough. Prednisone 10 mg take  4 each am x 2 days,   2 each am x 2 days,  1 each am x 2 days and stop (this is to eliminate allergies and inflammation from coughing) Protonix (pantoprazole) Take 30-60 min before first meal of the day and Pepcid 20 mg one bedtime plus chlorpheniramine 4 mg x 2 at bedtime (both available over the counter)  until cough is completely gone for at least a week without the need for cough suppression GERD diet   - may need rechallenge with neurontin fo rirritable larynx syndrome p methacholine challenge   11/22/2014 f/u ov/Wert re: chronic cough resolved on gerd rx  Chief Complaint  Patient presents with  . Follow-up    Pt states that her cough is much improved and is having less PND. No new co's today.   No obvious day to day or daytime variabilty or assoc sob  or cp or chest tightness, subjective wheeze overt sinus or hb symptoms. No unusual exp hx or h/o childhood pna/  asthma or knowledge of premature birth. rec Protonix (pantoprazole) Take 30-60 min before first meal of the day and Pepcid 20 mg one bedtime plus chlorpheniramine 4 mg x1-2 at bedtime (both available over the counter)  until cough is completely gone for at least a week without the need for cough suppression For drainage take chlortrimeton (chlorpheniramine) 4 mg every 4 hours available over the counter (may cause drowsiness)  GERD diet   02/11/2015 acute  ov/Wert re:  Chief Complaint  Patient presents with  . Acute Visit    Pt c/o increased cough for the past 3 wks. Cough is mainly non prod but sometimes produces min clear sputum.      Body mass index is 31.12 kg/(m^2).   Cough is sporadic day and noct/ not really clear what meds she's taking but clearly not using the ppi ac as instructed / cough no  better with delsym  No early am flares     No obvious day to day or daytime variability or assoc sob  or cp or chest tightness, subjective wheeze or overt sinus or hb symptoms. No unusual exp hx or h/o childhood pna/ asthma or knowledge of premature birth.  Also denies any obvious fluctuation of symptoms with weather or environmental changes or other aggravating or alleviating factors except as outlined above   Current Medications, Allergies, Complete Past Medical History, Past Surgical History, Family History, and Social History were reviewed in [employer] record.  ROS  The following are not active complaints unless bolded sore throat, dysphagia, dental problems, itching, sneezing,  nasal congestion or excess/ purulent secretions, ear ache,   fever, chills, sweats, unintended wt loss, classically pleuritic or exertional cp, hemoptysis,  orthopnea pnd or leg swelling, presyncope, palpitations, abdominal pain, anorexia, nausea, vomiting, diarrhea  or change in bowel or bladder habits, change in stools or urine, dysuria,hematuria,  rash, arthralgias, visual complaints, headache, numbness, weakness or ataxia or problems with walking or coordination,  change in mood/affect or memory.                 Objective:   Physical Exam  amb wf nad/harsh upper airway dry sounding cough  vital signs reviewed   10/23/2014          186 > 11/22/2014  187 > 02/11/2015 187  Wt Readings from Last 3 Encounters:  12/05/13 186 lb (84.369 kg)  04/05/13 183 lb (83.008 kg)  03/22/13 183 lb (83.008 kg)      HEENT: nl dentition, turbinates, and orophanx which is pristine. Nl external ear canals without cough reflex   NECK :  without JVD/Nodes/TM/ nl carotid upstrokes bilaterally   LUNGS: no acc muscle use, clear to A and P bilaterally without cough on insp or exp maneuvers   CV:  RRR  no s3 or murmur or increase in P2, no edema   ABD:  soft and nontender with nl excursion in the supine  position. No bruits or organomegaly, bowel sounds nl  MS:  warm without deformities, calf tenderness, cyanosis or clubbing  SKIN: warm and dry without lesions    NEURO:  alert, approp, no deficits    CXR  12/05/2013 : Lungs are clear. Heart size is normal. The aorta is mildly  torturous. There is no pneumothorax or pleural effusion.  Labs ordered : allergy profile      Assessment & Plan:

## 2015-02-11 NOTE — Patient Instructions (Addendum)
Please remember to go to the lab   department downstairs for your tests - we will call you with the results when they are available.    First take delsym two tsp every 12 hours - Swallowing water or using ice chips/non mint and menthol containing candies (such as lifesavers or sugarless jolly ranchers) are also effective.  You should rest your voice and avoid activities that you know make you cough.  Prednisone 10 mg take  4 each am x 2 days,   2 each am x 2 days,  1 each am x 2 days and stop (this is to eliminate allergies and inflammation from coughing)  Protonix (pantoprazole) Take 30-60 min before first meal of the day and Pepcid 20 mg one bedtime plus chlorpheniramine 4 mg x 2 at bedtime (both available over the counter)   GERD (REFLUX)  is an extremely common cause of respiratory symptoms, many times with no significant heartburn at all.    It can be treated with medication, but also with lifestyle changes including avoidance of late meals, excessive alcohol, smoking cessation, and avoid fatty foods, chocolate, peppermint, colas, red wine, and acidic juices such as orange juice.  NO MINT OR MENTHOL PRODUCTS SO NO COUGH DROPS  USE HARD CANDY INSTEAD (jolley ranchers or Stover's or Lifesavers (all available in sugarless versions) NO OIL BASED VITAMINS - use powdered substitutes  Please schedule a follow up office visit in 4 weeks, sooner if needed with all medications in hand

## 2015-02-11 NOTE — Assessment & Plan Note (Signed)
-   onset Oct 2014 - cyclical cough regimen 12/05/2013 > resolved  - recurred 10/23/14 > resolved again  - allergy profile 02/11/2015 >>>   In retrospect not really the cough every 100% resolved and now flared again s patter so will need further w/u  I had an extended discussion with the patient reviewing all relevant studies completed to date and  lasting 15 to 20 minutes of a 25 minute visit    1)  I reviewed with the patient the process we employ  here of optimizing symptoms using a minimum number of medications using serial follow up visits to achieve the best outcome; however, this does require  a buy in / commitment on the pt's part to achieve and maintain accurate medication reconciliation and to take this process as seriously as we do or we are unlikely to achieve the stated goal.   2) Until we are sure the patient is doing what we asking them to do it makes no sense to ask them to do more> f/u in4 weeks with all meds in hand  3) Each maintenance medication was reviewed in detail including most importantly the difference between maintenance and prns and under what circumstances the prns are to be triggered using an action plan format that is not reflected in the computer generated alphabetically organized AVS.    Please see instructions for details which were reviewed in writing and the patient given a copy highlighting the part that I personally wrote and discussed at today's ov.

## 2015-02-12 LAB — ALLERGY FULL PROFILE
Allergen, D pternoyssinus,d7: <0.1 kU/L
Allergen,Goose feathers, e70: <0.1 kU/L
Alternaria Alternata: <0.1 kU/L
Bahia Grass: <0.1 kU/L
Bermuda Grass: <0.1 kU/L
Box Elder IgE: <0.1 kU/L
Cat Dander: <0.1 kU/L
Curvularia lunata: <0.1 kU/L
Dog Dander: <0.1 kU/L
Elm IgE: <0.1 kU/L
Fescue: <0.1 kU/L
G005 Rye, Perennial: <0.1 kU/L
G009 Red Top: <0.1 kU/L
Helminthosporium halodes: <0.1 kU/L
House Dust Hollister: <0.1 kU/L
IGE (IMMUNOGLOBULIN E), SERUM: 6 kU/L (ref <115)
Lamb's Quarters: <0.1 kU/L
Oak: <0.1 kU/L
Plantain: <0.1 kU/L
Sycamore Tree: <0.1 kU/L

## 2015-03-11 ENCOUNTER — Other Ambulatory Visit: Payer: Medicare Other

## 2015-03-11 ENCOUNTER — Encounter: Payer: Self-pay | Admitting: Internal Medicine

## 2015-03-11 ENCOUNTER — Ambulatory Visit (INDEPENDENT_AMBULATORY_CARE_PROVIDER_SITE_OTHER)
Admission: RE | Admit: 2015-03-11 | Discharge: 2015-03-11 | Disposition: A | Discharge status: Home / Self Care | Payer: Medicare Other | Source: Ambulatory Visit | Attending: Internal Medicine | Admitting: Internal Medicine

## 2015-03-11 ENCOUNTER — Ambulatory Visit (INDEPENDENT_AMBULATORY_CARE_PROVIDER_SITE_OTHER): Payer: Medicare Other | Admitting: Internal Medicine

## 2015-03-11 ENCOUNTER — Other Ambulatory Visit (INDEPENDENT_AMBULATORY_CARE_PROVIDER_SITE_OTHER): Payer: Medicare Other

## 2015-03-11 VITALS — BP 164/90 | HR 95 | Ht 65.0 in | Wt 191.4 lb

## 2015-03-11 DIAGNOSIS — E669 Obesity, unspecified: Secondary | ICD-10-CM

## 2015-03-11 DIAGNOSIS — M7989 Other specified soft tissue disorders: Secondary | ICD-10-CM | POA: Insufficient documentation

## 2015-03-11 DIAGNOSIS — D611 Drug-induced aplastic anemia: Secondary | ICD-10-CM

## 2015-03-11 DIAGNOSIS — I10 Essential (primary) hypertension: Secondary | ICD-10-CM

## 2015-03-11 DIAGNOSIS — R0600 Dyspnea, unspecified: Secondary | ICD-10-CM

## 2015-03-11 DIAGNOSIS — R058 Other specified cough: Secondary | ICD-10-CM

## 2015-03-11 DIAGNOSIS — R05 Cough: Secondary | ICD-10-CM

## 2015-03-11 LAB — CBC WITH DIFFERENTIAL/PLATELET
Basophils Absolute: 0 10*3/uL (ref 0.0–0.1)
Basophils Relative: 0.4 % (ref 0.0–3.0)
Eosinophils Absolute: 0.2 10*3/uL (ref 0.0–0.7)
Eosinophils Relative: 2.1 % (ref 0.0–5.0)
HEMATOCRIT: 36.8 % (ref 36.0–46.0)
HEMOGLOBIN: 12.2 g/dL (ref 12.0–15.0)
Lymphs Abs: 5.1 10*3/uL — ABNORMAL HIGH (ref 0.7–4.0)
MCHC: 33.1 g/dL (ref 30.0–36.0)
MCV: 93.6 fl (ref 78.0–100.0)
MONOS PCT: 8.3 % (ref 3.0–12.0)
Monocytes Absolute: 0.7 10*3/uL (ref 0.1–1.0)
NEUTROS ABS: 2.5 10*3/uL (ref 1.4–7.7)
Neutrophils Relative %: 29.6 % — ABNORMAL LOW (ref 43.0–77.0)
Platelets: 306 10*3/uL (ref 150.0–400.0)
RBC: 3.93 Mil/uL (ref 3.87–5.11)
RDW: 14.9 % (ref 11.5–15.5)
WBC: 8.6 10*3/uL (ref 4.0–10.5)

## 2015-03-11 LAB — BASIC METABOLIC PANEL
BUN: 13 mg/dL (ref 6–23)
CO2: 28 mEq/L (ref 19–32)
Calcium: 9.5 mg/dL (ref 8.4–10.5)
Chloride: 106 mEq/L (ref 96–112)
Creatinine, Ser: 0.76 mg/dL (ref 0.40–1.20)
GFR: 77.4 mL/min (ref >60.00)
GLUCOSE: 99 mg/dL (ref 70–99)
POTASSIUM: 3.7 meq/L (ref 3.5–5.1)
SODIUM: 142 meq/L (ref 135–145)

## 2015-03-11 LAB — HEPATIC FUNCTION PANEL
ALBUMIN: 4.1 g/dL (ref 3.5–5.2)
ALT: 29 U/L (ref 0–35)
AST: 22 U/L (ref 0–37)
Alkaline Phosphatase: 75 U/L (ref 39–117)
Bilirubin, Direct: 0.1 mg/dL (ref 0.0–0.3)
Total Bilirubin: 0.4 mg/dL (ref 0.2–1.2)
Total Protein: 6.7 g/dL (ref 6.0–8.3)

## 2015-03-11 LAB — TSH: TSH: 3.63 u[IU]/mL (ref 0.35–4.50)

## 2015-03-11 LAB — BRAIN NATRIURETIC PEPTIDE: Pro B Natriuretic peptide (BNP): 182 pg/mL — ABNORMAL HIGH (ref 0.0–100.0)

## 2015-03-11 MED ORDER — TRIAMTERENE-HCTZ 37.5-25 MG PO TABS: 1.0000 | ORAL_TABLET | Freq: Every day | ORAL | Status: AC

## 2015-03-11 NOTE — Progress Notes (Signed)
Quick Note:  Spoke with pt and notified of results per Dr. Wert. Pt verbalized understanding and denied any questions.  ______ 

## 2015-03-11 NOTE — Assessment & Plan Note (Signed)
Really not suggestive of chf in that not limited by sob from activity and no orthopnea

## 2015-03-11 NOTE — Progress Notes (Signed)
Subjective:    Patient ID: [name], female    DOB: 03/24/1931     MRN: [id number]     Brief patient profile:  [id number]  yowf never smoker never resp problems until fall 2014 abruptly ill with cough ? Related to cold coughed ever since esp at hs referred to pulmonary clinic 12/05/2013 by Dr Wynelle LinkSun   History of Present Illness  12/05/2013 1st [facility] office visit/ Datra Clary  Chief Complaint  Patient presents with  . Pulmonary Consult    Referred per Dr. Wynelle LinkSun. Pt c/o cough since Fall 2014.  She states cough is non prod and esp worse at night.   dry cough daily since Oct 2014 waxes and wanes worse at hs no better with cough drops rec The key to effective treatment for your cough is eliminating the non-stop cycle of cough  First take delsym two tsp every 12 hours and supplement if needed with tylenol #3  up to 1 every 4 hours   Once you have eliminated the cough for 3 straight days try reducing the tylenol #3  first,  then the delsym as tolerated.   Prednisone 10 mg take  4 each am x 2 days,   2 each am x 2 days,  1 each am x 2 days and stop (this is to eliminate allergies and inflammation from coughing) Protonix (pantoprazole) Take 30-60 min before first meal of the day and Pepcid 20 mg one bedtime plus chlorpheniramine 4 mg x 2 at bedtime (both available over the counter)  until cough is completely gone for at least a week without the need for cough suppression GERD diet    10/23/2014 f/u ov/Averey Trompeter re: recurrent cough  Chief Complaint  Patient presents with  . Follow-up    Pt states that her cough started back in Nov 2015- non prod. She is having some PND.   cough is dry, somewhat worse at hs but also bothers during the day some every day - had improved from last ov but indolent onset persistent cough since Nov 2015 not on ppi as maintenance rx - sense of pnds but no excess mucus production or noct disturbance  - no response to otcs rec First take delsym two tsp every 12 hours -  Swallowing water or using ice chips/non mint and menthol containing candies (such as lifesavers or sugarless jolly ranchers) are also effective.  You should rest your voice and avoid activities that you know make you cough. Prednisone 10 mg take  4 each am x 2 days,   2 each am x 2 days,  1 each am x 2 days and stop (this is to eliminate allergies and inflammation from coughing) Protonix (pantoprazole) Take 30-60 min before first meal of the day and Pepcid 20 mg one bedtime plus chlorpheniramine 4 mg x 2 at bedtime (both available over the counter)  until cough is completely gone for at least a week without the need for cough suppression GERD diet   - may need rechallenge with neurontin fo rirritable larynx syndrome p methacholine challenge   11/22/2014 f/u ov/Macyn Shropshire re: chronic cough improved on gerd rx  Chief Complaint  Patient presents with  . Follow-up    Pt states that her cough is much improved and is having less PND. No new co's today.   No obvious day to day or daytime variabilty or assoc sob  or cp or chest tightness, subjective wheeze overt sinus or hb symptoms. No unusual exp hx or  h/o childhood pna/ asthma or knowledge of premature birth. rec Protonix (pantoprazole) Take 30-60 min before first meal of the day and Pepcid 20 mg one bedtime plus chlorpheniramine 4 mg x1-2 at bedtime (both available over the counter)  until cough is completely gone for at least a week without the need for cough suppression For drainage take chlortrimeton (chlorpheniramine) 4 mg every 4 hours available over the counter (may cause drowsiness)  GERD diet    02/11/2015 acute  ov/Indiyah Paone re: chronic cough  Chief Complaint  Patient presents with  . Acute Visit    Pt c/o increased cough for the past 3 wks. Cough is mainly non prod but sometimes produces min clear sputum.    Cough is sporadic day and noct/ not really clear what meds she's taking but clearly not using the ppi ac as instructed / cough no better with  delsym  No early am flares  rec Please remember to go to the lab   department downstairs for your tests - we will call you with the results when they are available. First take delsym two tsp every 12 hours - Swallowing water or using ice chips/non mint and menthol containing candies (such as lifesavers or sugarless jolly ranchers) are also effective.  You should rest your voice and avoid activities that you know make you cough. Prednisone 10 mg take  4 each am x 2 days,   2 each am x 2 days,  1 each am x 2 days and stop (this is to eliminate allergies and inflammation from coughing) Protonix (pantoprazole) Take 30-60 min before first meal of the day and Pepcid 20 mg one bedtime plus chlorpheniramine 4 mg x 2 at bedtime (both available over the counter)  GERD (REFLUX) diet    03/11/2015  f/u ov/Laquonda Welby re: cough gone, swelling new x 3 weeks assoc with hbp Chief Complaint  Patient presents with  . Follow-up    Pt states that her cough is much better since the last visit "99% gone".  No new co's today.    Now moved into indep living in [place],  no change in diet/ min sob/ never had hbp or leg swelling previously/     No obvious day to day or daytime variability or assoc sob  or cp or chest tightness, subjective wheeze or overt sinus or hb symptoms. No unusual exp hx or h/o childhood pna/ asthma or knowledge of premature birth.  Also denies any obvious fluctuation of symptoms with weather or environmental changes or other aggravating or alleviating factors except as outlined above   Current Medications, Allergies, Complete Past Medical History, Past Surgical History, Family History, and Social History were reviewed in [employer] record.  ROS  The following are not active complaints unless bolded sore throat, dysphagia, dental problems, itching, sneezing,  nasal congestion or excess/ purulent secretions, ear ache,   fever, chills, sweats, unintended wt loss, classically pleuritic  or exertional cp, hemoptysis,  orthopnea pnd or leg swelling, presyncope, palpitations, abdominal pain, anorexia, nausea, vomiting, diarrhea  or change in bowel or bladder habits, change in stools or urine, dysuria,hematuria,  rash, arthralgias, visual complaints, headache, numbness, weakness or ataxia or problems with walking or coordination,  change in mood/affect or memory.                 Objective:   Physical Exam  amb wf nad    vital signs reviewed /  hbp noted   10/23/2014  186 > 11/22/2014  187 > 02/11/2015 187  Wt Readings from Last 3 Encounters:  12/05/13 186 lb (84.369 kg)  04/05/13 183 lb (83.008 kg)  03/22/13 183 lb (83.008 kg)      HEENT: nl dentition, turbinates, and orophanx which is pristine. Nl external ear canals without cough reflex   NECK :  without JVD/Nodes/TM/ nl carotid upstrokes bilaterally   LUNGS: no acc muscle use, clear to A and P bilaterally without cough on insp or exp maneuvers   CV:  RRR  no s3 or murmur or increase in P2, 2+ lower ext pitting  edema   ABD:  soft and nontender with nl excursion in the supine position. No bruits or organomegaly, bowel sounds nl  MS:  warm without deformities, calf tenderness, cyanosis or clubbing  SKIN: warm and dry without lesions    NEURO:  alert, approp, no deficits      CXR PA and Lateral:   03/11/2015 :    I personally reviewed images and agree with radiology impression as follows:   COPD/chronic changes. Cardiomegaly. No active disease.       Labs ordered/ reviewed:      Chemistry      Component Value Date/Time   NA 142 03/11/2015 1052   K 3.7 03/11/2015 1052   CL 106 03/11/2015 1052   CO2 28 03/11/2015 1052   BUN 13 03/11/2015 1052   CREATININE 0.76 03/11/2015 1052      Component Value Date/Time   CALCIUM 9.5 03/11/2015 1052   ALKPHOS 75 03/11/2015 1052   AST 22 03/11/2015 1052   ALT 29 03/11/2015 1052   BILITOT 0.4 03/11/2015 1052    Alb 4.1     Lab Results  Component  Value Date   WBC 8.6 03/11/2015   HGB 12.2 03/11/2015   HCT 36.8 03/11/2015   MCV 93.6 03/11/2015   PLT 306.0 03/11/2015         Lab Results  Component Value Date   TSH 3.63 03/11/2015     Lab Results  Component Value Date   PROBNP 182.0* 03/11/2015               Assessment & Plan:   Outpatient Encounter Prescriptions as of 03/11/2015  Medication Sig  . famotidine (PEPCID) 20 MG tablet One at bedtime  . latanoprost (XALATAN) 0.005 % ophthalmic solution Place 1 drop into both eyes at bedtime.  Marland Kitchen levothyroxine (SYNTHROID, LEVOTHROID) 50 MCG tablet Take 50 mcg by mouth daily before breakfast.   . Multiple Vitamin (MULTIVITAMIN) capsule Take 1 capsule by mouth daily.  . pantoprazole (PROTONIX) 40 MG tablet Take 1 tablet (40 mg total) by mouth daily. Take 30-60 min before first meal of the day  . triamterene-hydrochlorothiazide (MAXZIDE-25) 37.5-25 MG tablet Take 1 tablet by mouth daily.  . [DISCONTINUED] predniSONE (DELTASONE) 10 MG tablet Take  4 each am x 2 days,   2 each am x 2 days,  1 each am x 2 days and stop   No facility-administered encounter medications on file as of 03/11/2015.

## 2015-03-11 NOTE — Assessment & Plan Note (Signed)
-   onset Oct 2014 - cyclical cough regimen 12/05/2013 > resolved  - recurred 10/23/14 > resolved again on gerd rx  - allergy profile 02/11/2015 >   Eos, 0.2,  IgE 6  Neg RAST   Clearly appear to be gerd related but gerd may not be the primary problem. rec 3 month total acid suppression and continue diet indef If worse on gerd rx > return here If worse p gerd rx stopped > resume and consider gi eval  I had an extended discussion with the patient reviewing all relevant studies completed to date and  lasting 15 to 20 minutes of a 25 minute visit    1) Explained the natural history of uri and why it's necessary in patients at risk to treat GERD aggressively - at least  short term -   to reduce risk of evolving cyclical cough initially  triggered by epithelial injury and a heightened sensitivty to the effects of any upper airway irritants,  most importantly acid - related - then perpetuated by epithelial injury related to the cough itself as the upper airway collapses on itself.  That is, the more sensitive the epithelium becomes once it is damaged by the virus, the more the ensuing irritability> the more the cough, the more the secondary reflux (especially in those prone to reflux) the more the irritation of the sensitive mucosa and so on in a  Classic cyclical pattern.    2)  Each maintenance medication was reviewed in detail including most importantly the difference between maintenance and prns and under what circumstances the prns are to be triggered using an action plan format that is not reflected in the computer generated alphabetically organized AVS.    Please see instructions for details which were reviewed in writing and the patient given a copy highlighting the part that I personally wrote and discussed at today's ov.

## 2015-03-11 NOTE — Patient Instructions (Signed)
maxzide-25  One daily until you see Dr Wynelle LinkSun in about 2 weeks  Please remember to go to the lab and x-ray department downstairs for your tests - we will call you with the results when they are available.   Stay on acid suppression x 3 full months then taper off and see if the cough comes back - in the event you have a cold, restart the suppression until cold is gone   If you are satisfied with your treatment plan,  let your doctor know and he/she can either refill your medications or you can return here when your prescription runs out.     If in any way you are not 100% satisfied,  please tell us.  If 100% better, tell your friends!  Pulmonary follow up is as needed

## 2015-03-11 NOTE — Assessment & Plan Note (Signed)
Onset around 1st oct 2016   No clear cause > rx maxzide 25 and Follow up per Primary Care planned

## 2015-03-11 NOTE — Assessment & Plan Note (Addendum)
Assoc with leg swelling but nl tsh, no evidence chf/ nl albumin > start maxzide -25 one daily > Follow up per Primary Care planned  2 weeks

## 2015-03-11 NOTE — Assessment & Plan Note (Signed)
Body mass index is 31.85    Lab Results  Component Value Date   TSH 1.094 08/21/2010     Contributing to gerd tendency/ doe/reviewed the need and the process to achieve and maintain neg calorie balance > defer f/u primary care including intermittently monitoring thyroid status

## 2015-03-12 ENCOUNTER — Encounter: Payer: Self-pay | Admitting: Internal Medicine

## 2015-03-12 LAB — PATHOLOGIST SMEAR REVIEW

## 2015-03-13 ENCOUNTER — Telehealth: Payer: Self-pay | Admitting: Internal Medicine

## 2015-03-13 NOTE — Telephone Encounter (Signed)
lmtcb x1 for pt. 

## 2015-03-14 MED ORDER — PANTOPRAZOLE SODIUM 40 MG PO TBEC: 40.0000 mg | DELAYED_RELEASE_TABLET | Freq: Every day | ORAL | Status: AC

## 2015-03-14 NOTE — Telephone Encounter (Signed)
Pt states that pharmacy did not receive RX for Protonix.  Rx sent to pharmacy. Pt notified. Nothing further needed.

## 2015-10-13 ENCOUNTER — Other Ambulatory Visit: Payer: Self-pay

## 2015-10-13 DIAGNOSIS — Z1231 Encounter for screening mammogram for malignant neoplasm of breast: Secondary | ICD-10-CM

## 2015-11-21 ENCOUNTER — Ambulatory Visit
Admission: RE | Admit: 2015-11-21 | Discharge: 2015-11-21 | Disposition: A | Discharge status: Home / Self Care | Payer: Medicare Other | Source: Ambulatory Visit

## 2015-11-21 ENCOUNTER — Other Ambulatory Visit: Payer: Self-pay | Admitting: Family Medicine

## 2015-11-21 DIAGNOSIS — Z1231 Encounter for screening mammogram for malignant neoplasm of breast: Secondary | ICD-10-CM

## 2016-02-11 ENCOUNTER — Other Ambulatory Visit: Payer: Self-pay | Admitting: Internal Medicine

## 2016-10-12 ENCOUNTER — Other Ambulatory Visit: Payer: Self-pay | Admitting: Family Medicine

## 2016-10-12 DIAGNOSIS — Z1231 Encounter for screening mammogram for malignant neoplasm of breast: Secondary | ICD-10-CM

## 2016-11-22 ENCOUNTER — Ambulatory Visit: Payer: Medicare Other

## 2016-11-23 ENCOUNTER — Ambulatory Visit
Admission: RE | Admit: 2016-11-23 | Discharge: 2016-11-23 | Disposition: A | Discharge status: Home / Self Care | Payer: Medicare Other | Source: Ambulatory Visit | Attending: Family Medicine | Admitting: Family Medicine

## 2016-11-23 DIAGNOSIS — Z1231 Encounter for screening mammogram for malignant neoplasm of breast: Secondary | ICD-10-CM

## 2017-10-19 ENCOUNTER — Other Ambulatory Visit: Payer: Self-pay | Admitting: Family Medicine

## 2017-10-19 DIAGNOSIS — Z1231 Encounter for screening mammogram for malignant neoplasm of breast: Secondary | ICD-10-CM

## 2017-12-01 ENCOUNTER — Ambulatory Visit
Admission: RE | Admit: 2017-12-01 | Discharge: 2017-12-01 | Disposition: A | Discharge status: Home / Self Care | Payer: Medicare Other | Source: Ambulatory Visit | Attending: Family Medicine | Admitting: Family Medicine

## 2017-12-01 DIAGNOSIS — Z1231 Encounter for screening mammogram for malignant neoplasm of breast: Secondary | ICD-10-CM

## 2018-10-25 ENCOUNTER — Other Ambulatory Visit: Payer: Self-pay | Admitting: Family Medicine

## 2018-10-25 ENCOUNTER — Other Ambulatory Visit: Payer: Self-pay | Admitting: Internal Medicine

## 2018-10-25 DIAGNOSIS — Z1231 Encounter for screening mammogram for malignant neoplasm of breast: Secondary | ICD-10-CM

## 2018-12-11 ENCOUNTER — Ambulatory Visit
Admission: RE | Admit: 2018-12-11 | Discharge: 2018-12-11 | Disposition: A | Discharge status: Home / Self Care | Payer: Medicare Other | Source: Ambulatory Visit | Attending: Internal Medicine | Admitting: Internal Medicine

## 2018-12-11 ENCOUNTER — Other Ambulatory Visit: Payer: Self-pay

## 2018-12-11 DIAGNOSIS — Z1231 Encounter for screening mammogram for malignant neoplasm of breast: Secondary | ICD-10-CM

## 2019-11-05 ENCOUNTER — Other Ambulatory Visit: Payer: Self-pay | Admitting: Family Medicine

## 2019-11-05 DIAGNOSIS — Z1231 Encounter for screening mammogram for malignant neoplasm of breast: Secondary | ICD-10-CM

## 2019-12-12 ENCOUNTER — Other Ambulatory Visit: Payer: Self-pay

## 2019-12-12 ENCOUNTER — Ambulatory Visit
Admission: RE | Admit: 2019-12-12 | Discharge: 2019-12-12 | Disposition: A | Discharge status: Home / Self Care | Payer: Medicare Other | Source: Ambulatory Visit | Attending: Family Medicine | Admitting: Family Medicine

## 2019-12-12 DIAGNOSIS — Z1231 Encounter for screening mammogram for malignant neoplasm of breast: Secondary | ICD-10-CM

## 2020-11-03 ENCOUNTER — Other Ambulatory Visit: Payer: Self-pay | Admitting: Family Medicine

## 2020-11-03 DIAGNOSIS — Z1231 Encounter for screening mammogram for malignant neoplasm of breast: Secondary | ICD-10-CM

## 2020-12-25 ENCOUNTER — Inpatient Hospital Stay: Admission: RE | Admit: 2020-12-25 | Payer: Medicare PPO | Source: Ambulatory Visit

## 2021-02-13 ENCOUNTER — Other Ambulatory Visit: Payer: Self-pay

## 2021-02-13 ENCOUNTER — Ambulatory Visit
Admission: RE | Admit: 2021-02-13 | Discharge: 2021-02-13 | Disposition: A | Discharge status: Home / Self Care | Payer: Medicare PPO | Source: Ambulatory Visit | Attending: Family Medicine | Admitting: Family Medicine

## 2021-02-13 DIAGNOSIS — Z1231 Encounter for screening mammogram for malignant neoplasm of breast: Secondary | ICD-10-CM

## 2021-10-22 DEATH — deceased

## 2021-11-16 IMAGING — MG MM DIGITAL SCREENING BILAT W/ TOMO AND CAD
6 of 10 series · 6 of 30 positions shown · non-contrast
Comparison: Previous exam(s).

CLINICAL DATA: Screening.

EXAM:
DIGITAL SCREENING BILATERAL MAMMOGRAM WITH TOMOSYNTHESIS AND CAD
TECHNIQUE: Bilateral screening digital craniocaudal and mediolateral oblique
mammograms were obtained. Bilateral screening digital breast
tomosynthesis was performed. The images were evaluated with
computer-aided detection.

[L MLO synth-2D]
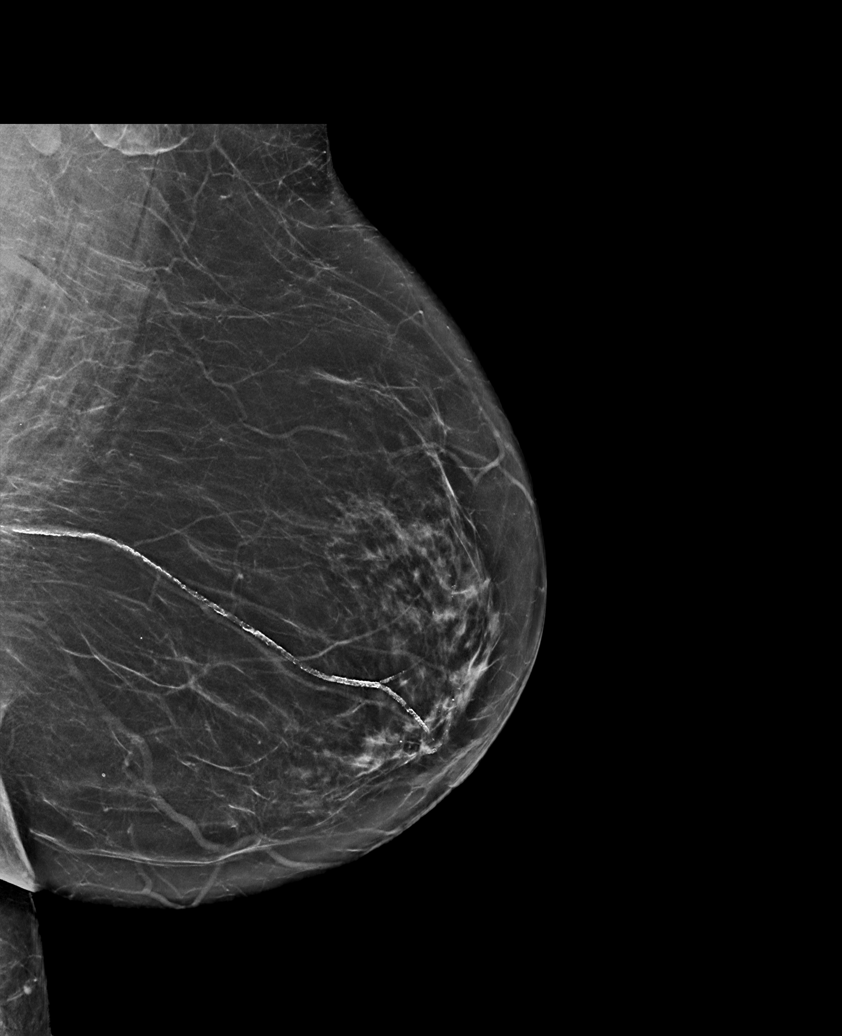

[R MLO synth-2D]
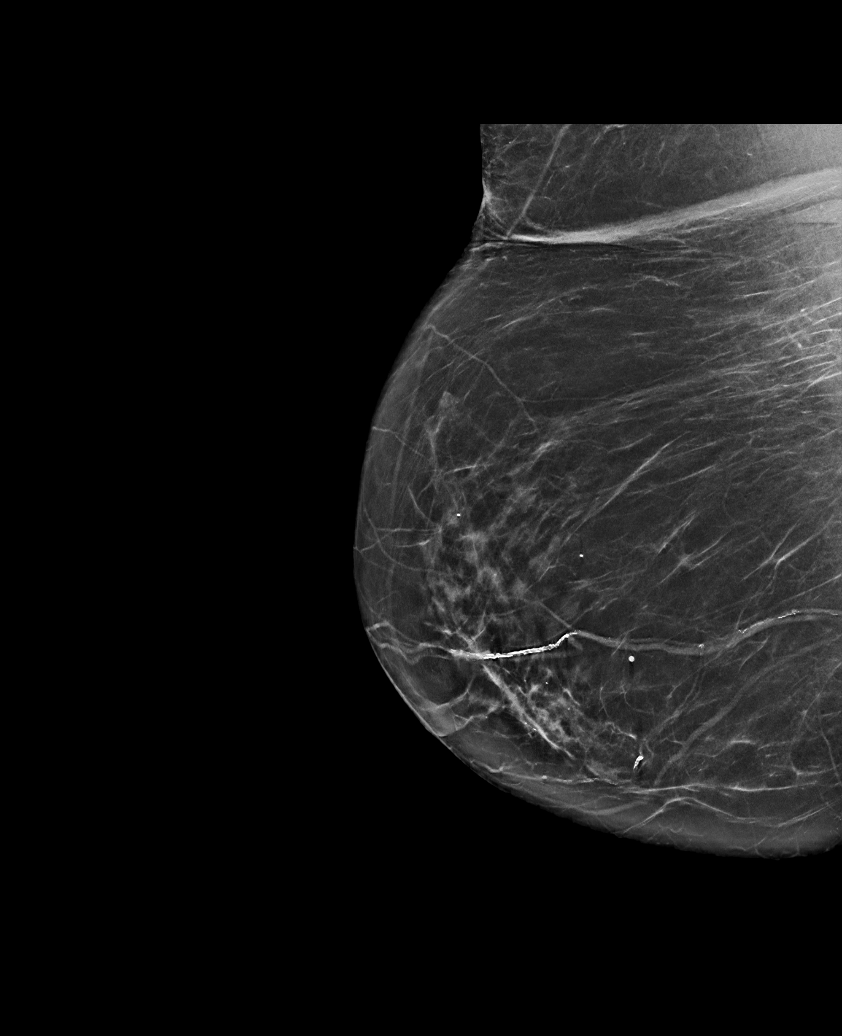

[L CC synth-2D]
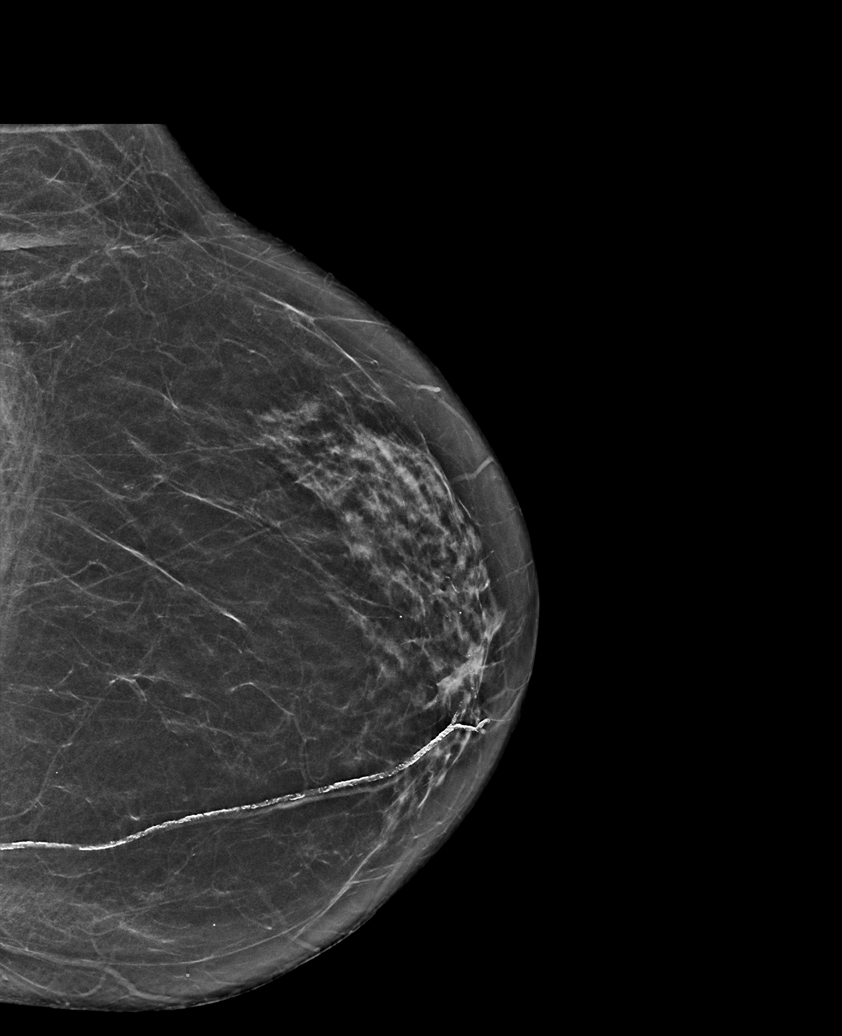

[R CC synth-2D]
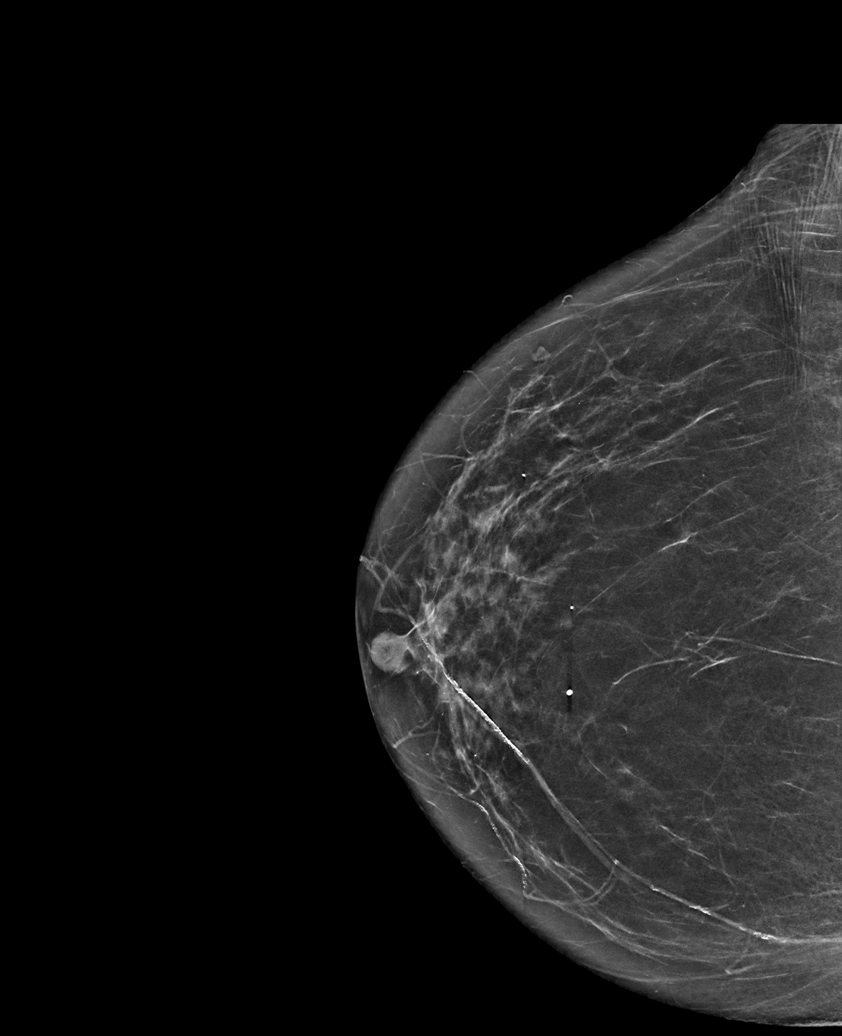

[R CV synth-2D]
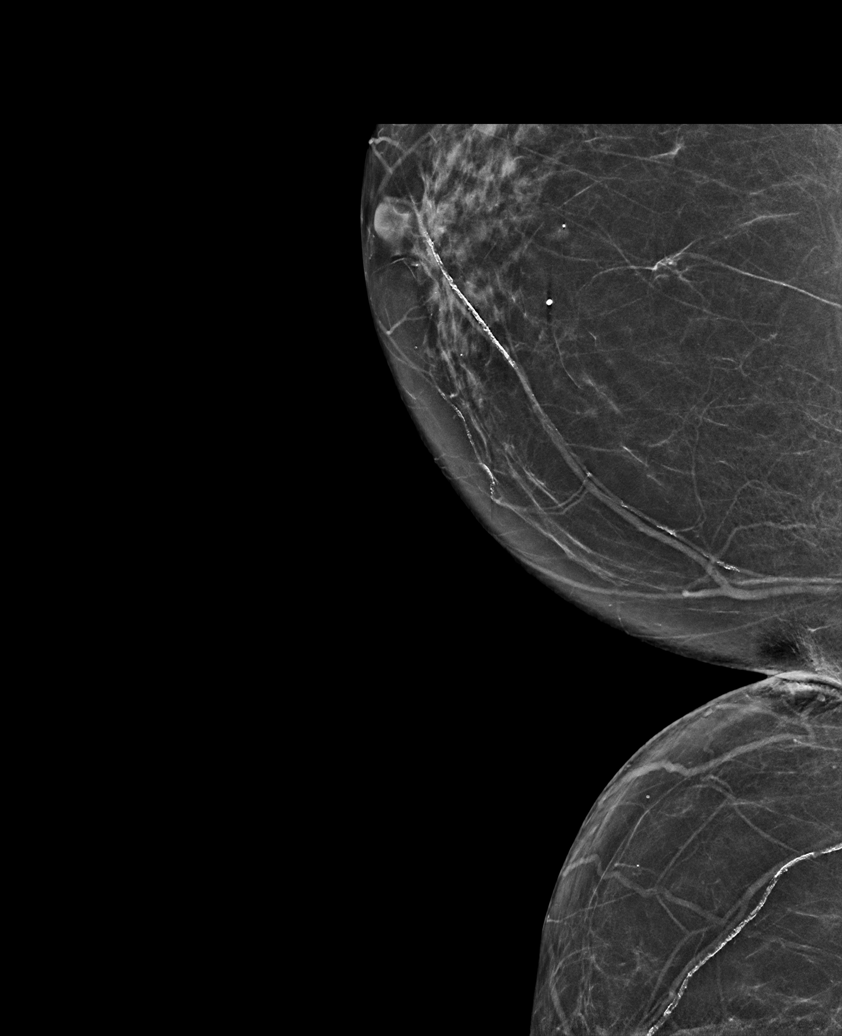

[R MLO tomo · tomo slice 35/69.0]
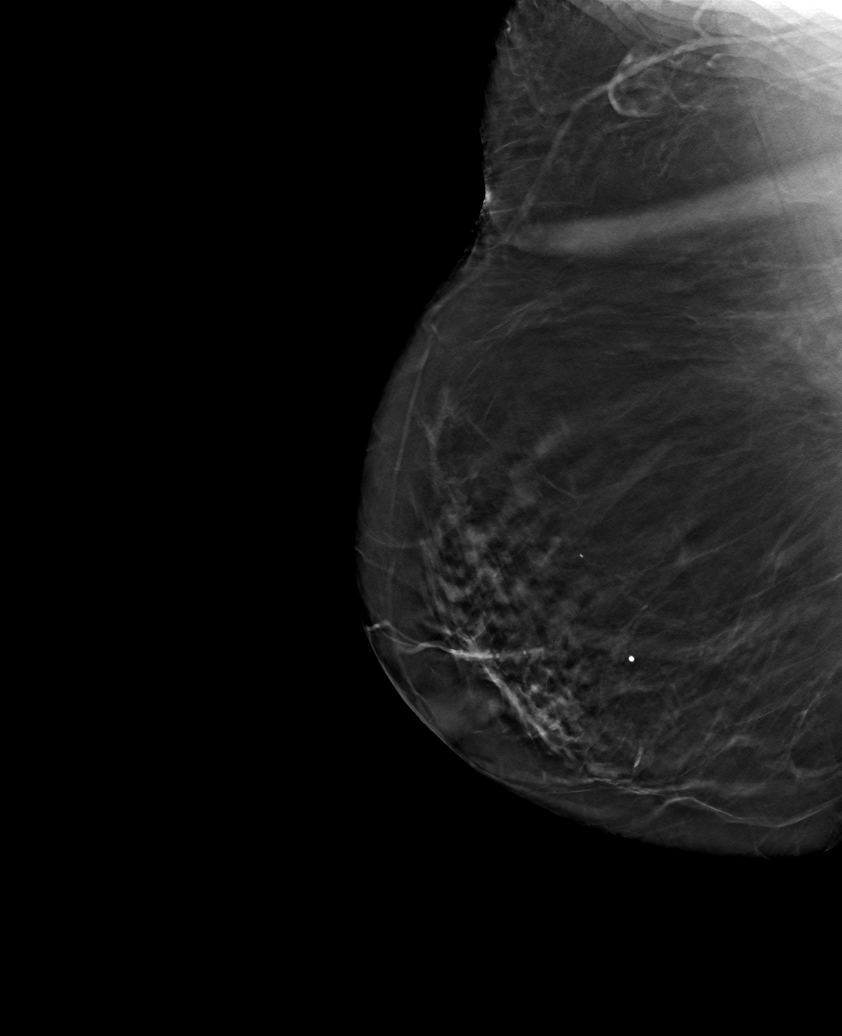

[6 of 30 positions shown; findings below may reference images not displayed]

ACR Breast Density Category b: There are scattered areas of
fibroglandular density.
FINDINGS: There are no findings suspicious for malignancy.
IMPRESSION: No mammographic evidence of malignancy. A result letter of this
screening mammogram will be mailed directly to the patient.

RECOMMENDATION:
Screening mammogram in one year. (Code:51-O-LD2)

BI-RADS CATEGORY  1: Negative.
